# Patient Record
Sex: Female | Born: 1975 | Hispanic: Yes | Marital: Married | Smoking: Never smoker
Health system: Southern US, Community
[De-identification: ages and names within clinical notes are randomized; demographics above are authoritative.]

## PROBLEM LIST (undated history)

## (undated) DIAGNOSIS — K219 Gastro-esophageal reflux disease without esophagitis: Secondary | ICD-10-CM

## (undated) DIAGNOSIS — E785 Hyperlipidemia, unspecified: Secondary | ICD-10-CM

## (undated) DIAGNOSIS — N361 Urethral diverticulum: Secondary | ICD-10-CM

## (undated) DIAGNOSIS — N9489 Other specified conditions associated with female genital organs and menstrual cycle: Secondary | ICD-10-CM

## (undated) DIAGNOSIS — E119 Type 2 diabetes mellitus without complications: Secondary | ICD-10-CM

## (undated) HISTORY — DX: Hyperlipidemia, unspecified: E78.5

## (undated) HISTORY — DX: Type 2 diabetes mellitus without complications: E11.9

## (undated) HISTORY — PX: OTHER SURGICAL HISTORY: SHX169

## (undated) HISTORY — DX: Other specified conditions associated with female genital organs and menstrual cycle: N94.89

## (undated) HISTORY — DX: Gastro-esophageal reflux disease without esophagitis: K21.9

## (undated) HISTORY — DX: Urethral diverticulum: N36.1

---

## 1997-11-03 ENCOUNTER — Other Ambulatory Visit: Admission: RE | Admit: 1997-11-03 | Discharge: 1997-11-03 | Payer: Self-pay | Admitting: *Deleted

## 1998-07-14 HISTORY — PX: ESOPHAGOGASTRODUODENOSCOPY: SHX1529

## 1998-12-17 ENCOUNTER — Other Ambulatory Visit: Admission: RE | Admit: 1998-12-17 | Discharge: 1998-12-17 | Payer: Self-pay | Admitting: Family Medicine

## 1999-04-12 ENCOUNTER — Ambulatory Visit (HOSPITAL_COMMUNITY): Admission: RE | Admit: 1999-04-12 | Discharge: 1999-04-12 | Payer: Self-pay | Admitting: Gastroenterology

## 1999-07-15 HISTORY — PX: COLPOSCOPY: SHX161

## 2002-07-14 HISTORY — PX: TYMPANOPLASTY: SHX33

## 2006-04-06 ENCOUNTER — Encounter: Payer: Self-pay | Admitting: Maternal & Fetal Medicine

## 2006-04-13 ENCOUNTER — Encounter: Payer: Self-pay | Admitting: Maternal & Fetal Medicine

## 2006-05-04 ENCOUNTER — Encounter: Payer: Self-pay | Admitting: Maternal & Fetal Medicine

## 2006-06-01 ENCOUNTER — Encounter: Payer: Self-pay | Admitting: Maternal & Fetal Medicine

## 2006-06-03 ENCOUNTER — Encounter: Payer: Self-pay | Admitting: Obstetrics & Gynecology

## 2006-06-08 ENCOUNTER — Encounter: Payer: Self-pay | Admitting: Maternal & Fetal Medicine

## 2006-06-15 ENCOUNTER — Encounter: Payer: Self-pay | Admitting: Maternal & Fetal Medicine

## 2006-06-17 ENCOUNTER — Ambulatory Visit: Payer: Self-pay | Admitting: Obstetrics & Gynecology

## 2006-06-29 ENCOUNTER — Encounter: Payer: Self-pay | Admitting: Maternal & Fetal Medicine

## 2006-07-13 ENCOUNTER — Encounter: Payer: Self-pay | Admitting: Maternal & Fetal Medicine

## 2006-07-14 ENCOUNTER — Ambulatory Visit: Payer: Self-pay | Admitting: Obstetrics & Gynecology

## 2006-08-10 ENCOUNTER — Encounter: Payer: Self-pay | Admitting: Maternal & Fetal Medicine

## 2006-08-17 ENCOUNTER — Encounter: Payer: Self-pay | Admitting: Maternal & Fetal Medicine

## 2006-08-24 ENCOUNTER — Encounter: Payer: Self-pay | Admitting: Maternal & Fetal Medicine

## 2006-08-31 ENCOUNTER — Encounter: Payer: Self-pay | Admitting: Maternal & Fetal Medicine

## 2006-09-14 ENCOUNTER — Encounter: Payer: Self-pay | Admitting: Maternal & Fetal Medicine

## 2006-09-21 ENCOUNTER — Encounter: Payer: Self-pay | Admitting: Maternal & Fetal Medicine

## 2006-10-05 ENCOUNTER — Encounter: Payer: Self-pay | Admitting: Maternal & Fetal Medicine

## 2006-10-19 ENCOUNTER — Encounter: Payer: Self-pay | Admitting: Maternal & Fetal Medicine

## 2006-10-26 ENCOUNTER — Encounter: Payer: Self-pay | Admitting: Maternal & Fetal Medicine

## 2006-11-09 ENCOUNTER — Encounter: Payer: Self-pay | Admitting: Maternal & Fetal Medicine

## 2006-11-15 ENCOUNTER — Inpatient Hospital Stay: Payer: Self-pay

## 2007-12-15 ENCOUNTER — Ambulatory Visit: Payer: Self-pay | Admitting: Urology

## 2008-02-12 HISTORY — PX: OTHER SURGICAL HISTORY: SHX169

## 2009-03-05 ENCOUNTER — Emergency Department: Payer: Self-pay | Admitting: Emergency Medicine

## 2011-11-20 ENCOUNTER — Ambulatory Visit: Payer: Self-pay | Admitting: Family Medicine

## 2015-11-28 ENCOUNTER — Other Ambulatory Visit: Payer: Self-pay | Admitting: Certified Nurse Midwife

## 2015-11-28 DIAGNOSIS — N63 Unspecified lump in unspecified breast: Secondary | ICD-10-CM

## 2015-12-05 ENCOUNTER — Ambulatory Visit
Admission: RE | Admit: 2015-12-05 | Discharge: 2015-12-05 | Disposition: A | Discharge status: Home / Self Care | Payer: 59 | Source: Ambulatory Visit | Attending: Certified Nurse Midwife | Admitting: Certified Nurse Midwife

## 2015-12-05 ENCOUNTER — Ambulatory Visit: Payer: Self-pay

## 2015-12-05 DIAGNOSIS — N63 Unspecified lump in unspecified breast: Secondary | ICD-10-CM

## 2016-11-10 ENCOUNTER — Other Ambulatory Visit: Payer: Self-pay | Admitting: Certified Nurse Midwife

## 2016-12-05 ENCOUNTER — Ambulatory Visit: Payer: Self-pay | Admitting: Certified Nurse Midwife

## 2017-01-02 ENCOUNTER — Encounter: Payer: Self-pay | Admitting: Certified Nurse Midwife

## 2017-01-02 ENCOUNTER — Ambulatory Visit (INDEPENDENT_AMBULATORY_CARE_PROVIDER_SITE_OTHER): Payer: BLUE CROSS/BLUE SHIELD | Admitting: Certified Nurse Midwife

## 2017-01-02 VITALS — BP 100/60 | HR 81 | Ht 61.0 in | Wt 115.0 lb

## 2017-01-02 DIAGNOSIS — Z1231 Encounter for screening mammogram for malignant neoplasm of breast: Secondary | ICD-10-CM

## 2017-01-02 DIAGNOSIS — Z01419 Encounter for gynecological examination (general) (routine) without abnormal findings: Secondary | ICD-10-CM

## 2017-01-02 DIAGNOSIS — Z3041 Encounter for surveillance of contraceptive pills: Secondary | ICD-10-CM

## 2017-01-02 DIAGNOSIS — Z124 Encounter for screening for malignant neoplasm of cervix: Secondary | ICD-10-CM

## 2017-01-02 DIAGNOSIS — Z1239 Encounter for other screening for malignant neoplasm of breast: Secondary | ICD-10-CM

## 2017-01-02 MED ORDER — NORETHINDRONE 0.35 MG PO TABS: 1.0000 | ORAL_TABLET | Freq: Every day | ORAL | 3 refills | Status: DC

## 2017-01-02 NOTE — Progress Notes (Signed)
Gynecology Annual Exam  PCP: Marguerita Merles, MD  Chief Complaint:  Chief Complaint  Patient presents with  . Gynecologic Exam    History of Present Illness:Dominique Valdez is a 41 year old Other Race, Hispanic or Latino female, [place], who presents for her annual exam. She is having no significant GYN problems.  Her menses are irregular on the progesterone only pill. She was having them every 6 weeks to 6 months apart. Last menses was in April and lasted 2 weeks after missing a pill.  Menses last year would last 9 days with 2 heavier days with clots requiring pad changes 4x/day. Her She reports some cramping with menses that is usually relieved with ibuprofen.    The patient's past medical history is notable for a history of diabetes, reflux and hypercholesterolemia.  Her PCP is Dr Delight Stare at [facility]. Since her last annual GYN exam dated 11/28/15, she has had no significant changes in her health history.  She is sexually active. She is currently using a progesterone only pill for contraception. Does not want any future pregnancies.  Her most recent pap smear was obtained 11/28/2015 and was negative .  Her last mammogram was 12/05/2015 and was negative. She had a diagnostic mammogram and [place] on the left breast evaluating a possible mass, but no masses seen. There is no family history of breast cancer.  There is no family history of ovarian cancer.  The patient does do occasional self breast exams.  The patient does not smoke.  The patient does drink occasionally.  The patient does not use illegal drugs.  The patient exercises by walking her dog.  The patient does get adequate calcium in her diet.  She had a recent cholesterol screen in 2017 that was normal on lovastatin    The patient denies current symptoms of depression.    Review of Systems: Review of Systems  Constitutional: Negative for chills, fever and weight loss.  HENT: Negative for congestion, sinus pain and  sore throat.   Eyes: Negative for blurred vision and pain.  Respiratory: Negative for hemoptysis, shortness of breath and wheezing.   Cardiovascular: Negative for chest pain, palpitations and leg swelling.  Gastrointestinal: Negative for abdominal pain, blood in stool, diarrhea, heartburn, nausea and vomiting.  Genitourinary: Negative for dysuria, frequency, hematuria and urgency.       Positive for irregular bleeding  Musculoskeletal: Negative for back pain, joint pain and myalgias.  Skin: Negative for itching and rash.  Neurological: Negative for dizziness, tingling and headaches.  Endo/Heme/Allergies: Negative for environmental allergies and polydipsia. Does not bruise/bleed easily.       Negative for hirsutism, hot flashes   Psychiatric/Behavioral: Negative for depression. The patient is not nervous/anxious and does not have insomnia.     Past Medical History:  Past Medical History:  Diagnosis Date  . Adnexal mass   . Diabetes mellitus without complication ([place])   . GERD (gastroesophageal reflux disease)   . Hyperlipidemia   . Urethral diverticulum     Past Surgical History:  Past Surgical History:  Procedure Laterality Date  . COLPOSCOPY  2001   normal PJR  . ESOPHAGOGASTRODUODENOSCOPY  2000   [place]  . excision of urethral diverticulum  02/2008  . myringtomy     as a child  . TYMPANOPLASTY Left 2004    Family History:  Family History  Problem Relation Age of Onset  . Coronary artery disease Mother   . Diabetes Mother   .  Hypertension Mother   . Hypertension Father   . Diabetes Brother   . Hypertension Maternal Grandmother   . Renal Disease Maternal Grandmother   . Leukemia Paternal Grandmother 74  . Hyperthyroidism Paternal Grandmother   . Hypertension Paternal Grandfather   . Alzheimer's disease Paternal Grandfather     Social History:  Social History   Social History  . Marital status: Single    Spouse name: N/A  . Number of children: 2  .  Years of education: N/A   Occupational History  . Patient Care Coordinator    Social History Main Topics  . Smoking status: Never Smoker  . Smokeless tobacco: Never Used  . Alcohol use Yes     Comment: rare  . Drug use: No  . Sexual activity: Yes    Birth control/ protection: Pill     Comment: minipill   Other Topics Concern  . Not on file   Social History Narrative  . No narrative on file    Allergies:  Allergies  Allergen Reactions  . Bactrim [Sulfamethoxazole-Trimethoprim] Rash    Medications:  Current Outpatient Prescriptions:  .  fexofenadine (ALLEGRA) 180 MG tablet, Take 180 mg by mouth daily., Disp: , Rfl:  .  glipiZIDE (GLUCOTROL) 5 MG tablet, Take 5 mg by mouth daily before breakfast., Disp: , Rfl:  .  lisinopril (PRINIVIL,ZESTRIL) 2.5 MG tablet, , Disp: , Rfl: 10 .  lovastatin (MEVACOR) 20 MG tablet, , Disp: , Rfl: 10 .  norethindrone (CAMILA) 0.35 MG tablet, Take 1 tablet (0.35 mg total) by mouth daily., Disp: 84 tablet, Rfl: 3 .  ranitidine (ZANTAC) 150 MG tablet, Take 150 mg by mouth 2 (two) times daily., Disp: , Rfl:  .  Saxagliptin-Metformin (KOMBIGLYZE XR) 11-998 MG TB24, Take 1 tablet by mouth 2 (two) times daily., Disp: , Rfl:     Physical Exam Vitals: BP 100/60   Pulse 81   Ht 5' 1"  (1.549 m)   Wt 115 lb (52.2 kg)   LMP 10/26/2016 (Approximate)   BMI 21.73 kg/m   General: NAD HEENT: normocephalic, anicteric Neck: no thyroid enlargement, no palpable nodules, no cervical lymphadenopathy  Pulmonary: No increased work of breathing, CTAB Cardiovascular: RRR, without murmur  Breast: Breast symmetrical, no tenderness, no palpable nodules or masses, no skin or nipple retraction present, no nipple discharge.  No axillary, infraclavicular or supraclavicular lymphadenopathy. Abdomen: Soft, non-tender, non-distended.  Umbilicus without lesions.  No hepatomegaly or masses palpable. No evidence of hernia. Genitourinary:  External: Normal external female  genitalia.  Normal urethral meatus, normal Bartholin's and Skene's glands.    Vagina: Normal vaginal mucosa, no evidence of prolapse.    Cervix: anterior, parous, grossly normal in appearance, no bleeding, non-tender  Uterus: Retroflexed, normal size, shape, and consistency, decreased mobility, and non-tender  Adnexa: No adnexal masses, non-tender  Rectal: deferred  Lymphatic: no evidence of inguinal lymphadenopathy Extremities: no edema, erythema, or tenderness Neurologic: Grossly intact Psychiatric: mood appropriate, affect full     Assessment: 41 y.o. well woman exam Plan:   1) Breast cancer screening - recommend monthly self breast exam and annual mammograms. Mammogram was ordered today.  2) Cervical cancer screening - Pap was done. ASCCP guidelines and rational discussed.  Patient opts for yearly screening interval  3) Contraception -patient wishes to continue with the minipill. RX for Camilla x 1 year  4) RTO 1 year and prn  Dalia Heading, CNM

## 2017-01-03 ENCOUNTER — Encounter: Payer: Self-pay | Admitting: Certified Nurse Midwife

## 2017-01-03 DIAGNOSIS — E119 Type 2 diabetes mellitus without complications: Secondary | ICD-10-CM | POA: Insufficient documentation

## 2017-01-03 DIAGNOSIS — Z309 Encounter for contraceptive management, unspecified: Secondary | ICD-10-CM | POA: Insufficient documentation

## 2017-01-03 DIAGNOSIS — E785 Hyperlipidemia, unspecified: Secondary | ICD-10-CM | POA: Insufficient documentation

## 2017-01-03 DIAGNOSIS — K219 Gastro-esophageal reflux disease without esophagitis: Secondary | ICD-10-CM | POA: Insufficient documentation

## 2017-01-05 LAB — IGP,RFX APTIMA HPV ALL PTH: PAP Smear Comment: 0

## 2017-02-19 ENCOUNTER — Other Ambulatory Visit: Payer: Self-pay | Admitting: Nurse Practitioner

## 2017-02-19 DIAGNOSIS — K30 Functional dyspepsia: Secondary | ICD-10-CM

## 2017-02-19 DIAGNOSIS — R1013 Epigastric pain: Secondary | ICD-10-CM

## 2017-02-20 ENCOUNTER — Ambulatory Visit
Admission: RE | Admit: 2017-02-20 | Discharge: 2017-02-20 | Disposition: A | Discharge status: Home / Self Care | Payer: BLUE CROSS/BLUE SHIELD | Source: Ambulatory Visit | Attending: Nurse Practitioner | Admitting: Nurse Practitioner

## 2017-02-20 DIAGNOSIS — R1013 Epigastric pain: Secondary | ICD-10-CM | POA: Insufficient documentation

## 2017-02-20 DIAGNOSIS — K30 Functional dyspepsia: Secondary | ICD-10-CM

## 2017-09-10 ENCOUNTER — Other Ambulatory Visit: Payer: Self-pay | Admitting: Certified Nurse Midwife

## 2017-09-10 DIAGNOSIS — Z1231 Encounter for screening mammogram for malignant neoplasm of breast: Secondary | ICD-10-CM

## 2017-10-09 ENCOUNTER — Encounter (HOSPITAL_COMMUNITY): Payer: Self-pay

## 2017-10-09 ENCOUNTER — Ambulatory Visit
Admission: RE | Admit: 2017-10-09 | Discharge: 2017-10-09 | Disposition: A | Discharge status: Home / Self Care | Payer: BLUE CROSS/BLUE SHIELD | Source: Ambulatory Visit | Attending: Certified Nurse Midwife | Admitting: Certified Nurse Midwife

## 2017-10-09 DIAGNOSIS — Z1231 Encounter for screening mammogram for malignant neoplasm of breast: Secondary | ICD-10-CM | POA: Diagnosis present

## 2018-01-07 ENCOUNTER — Other Ambulatory Visit: Payer: Self-pay | Admitting: Certified Nurse Midwife

## 2018-01-12 NOTE — Progress Notes (Addendum)
Gynecology Annual Exam  PCP: Marguerita Merles, MD  Chief Complaint:  Chief Complaint  Patient presents with  . Gynecologic Exam    History of Present Illness:Dominique Valdez is a 42 year old  Hispanic or Latino female, [place], who presents for her annual exam. She is having some problem with lumpy tender area in her left breast. Her menses have been infrequent this past year on the progesterone only pill. She was having them every 6 weeks to 6 months apart prior to that..Last year she had only one menses. It lasted 2 weeks and was accompanied by cramping.  The patient's past medical history is notable for a history of diabetes, reflux and hypercholesterolemia.  Her PCP is Dr Delight Stare at [facility]. Since her last annual GYN exam dated 01/02/17, she has had no significant changes in her health history.  She is sexually active. She is currently using a progesterone only pill for contraception. Does not want any future pregnancies.  Her most recent pap smear was obtained 01/02/17 and was negative .  Her last mammogram was 10/09/17 and was negative. She had a diagnostic mammogram and [place] on the left breast evaluating a possible mass in the same area where she has felt tender lumpiness in 2017 There is no family history of breast cancer.  There is no family history of ovarian cancer.  The patient does do occasional self breast exams.  The patient does not smoke.  The patient does drink occasionally.  The patient does not use illegal drugs.  The patient exercises by walking her dog.  The patient may not get adequate calcium in her diet.  She had a recent cholesterol screen in 2018 that was borderline    The patient denies current symptoms of depression.    Review of Systems: Review of Systems  Constitutional: Negative for chills, fever and weight loss.  HENT: Negative for congestion, sinus pain and sore throat.   Eyes: Negative for blurred vision and pain.  Respiratory: Negative  for hemoptysis, shortness of breath and wheezing.   Cardiovascular: Negative for chest pain, palpitations and leg swelling.  Gastrointestinal: Negative for abdominal pain, blood in stool, diarrhea, heartburn, nausea and vomiting.  Genitourinary: Negative for dysuria, frequency, hematuria and urgency.       Positive for oligomenorrhea. Positive for vaginal odor and discharge at times  Musculoskeletal: Negative for back pain, joint pain and myalgias.  Skin: Negative for itching and rash.  Neurological: Negative for dizziness, tingling and headaches.  Endo/Heme/Allergies: Negative for environmental allergies and polydipsia. Does not bruise/bleed easily.       Negative for hirsutism, hot flashes   Psychiatric/Behavioral: Negative for depression. The patient is not nervous/anxious and does not have insomnia.   Breasts: positive for lumps and tenderness in left breast  Past Medical History:  Past Medical History:  Diagnosis Date  . Adnexal mass   . Diabetes mellitus without complication ([place])   . GERD (gastroesophageal reflux disease)   . Hyperlipidemia   . Urethral diverticulum     Past Surgical History:  Past Surgical History:  Procedure Laterality Date  . COLPOSCOPY  2001   normal PJR  . ESOPHAGOGASTRODUODENOSCOPY  2000   [place]  . excision of urethral diverticulum  02/2008  . myringtomy     as a child  . TYMPANOPLASTY Left 2004    Family History:  Family History  Problem Relation Age of Onset  . Coronary artery disease Mother   .  Diabetes Mother   . Hypertension Mother   . Hypertension Father   . Diabetes Brother   . Hypertension Maternal Grandmother   . Renal Disease Maternal Grandmother   . Leukemia Paternal Grandmother 29  . Hyperthyroidism Paternal Grandmother   . Hypertension Paternal Grandfather   . Alzheimer's disease Paternal Grandfather   . Breast cancer Neg Hx     Social History:  Social History   Socioeconomic History  . Marital status: Married      Spouse name: Not on file  . Number of children: 2  . Years of education: Not on file  . Highest education level: Not on file  Occupational History  . Occupation: Patient Care Coordinator  Social Needs  . Financial resource strain: Not on file  . Food insecurity:    Worry: Not on file    Inability: Not on file  . Transportation needs:    Medical: Not on file    Non-medical: Not on file  Tobacco Use  . Smoking status: Never Smoker  . Smokeless tobacco: Never Used  Substance and Sexual Activity  . Alcohol use: Yes    Comment: rare  . Drug use: No  . Sexual activity: Yes    Birth control/protection: Pill    Comment: minipill  Lifestyle  . Physical activity:    Days per week: 0 days    Minutes per session: 0 min  . Stress: Not at all  Relationships  . Social connections:    Talks on phone: Not on file    Gets together: Not on file    Attends religious service: Not on file    Active member of club or organization: Not on file    Attends meetings of clubs or organizations: Not on file    Relationship status: Not on file  . Intimate partner violence:    Fear of current or ex partner: Not on file    Emotionally abused: Not on file    Physically abused: Not on file    Forced sexual activity: Not on file  Other Topics Concern  . Not on file  Social History Narrative  . Not on file    Allergies:  Allergies  Allergen Reactions  . Bactrim [Sulfamethoxazole-Trimethoprim] Rash    Medications:  Current Outpatient Medications:  .  CAMILA 0.35 MG tablet, TAKE 1 TABLET BY MOUTH EVERY DAY, Disp: 84 tablet, Rfl: 0 .  fexofenadine (ALLEGRA) 180 MG tablet, Take 180 mg by mouth daily., Disp: , Rfl:  .  fluticasone (FLONASE) 50 MCG/ACT nasal spray, , Disp: , Rfl: 1 .  ibuprofen (ADVIL,MOTRIN) 800 MG tablet, , Disp: , Rfl: 5 .  lisinopril (PRINIVIL,ZESTRIL) 2.5 MG tablet, , Disp: , Rfl: 10 .  ranitidine (ZANTAC) 150 MG tablet, Take 150 mg by mouth 2 (two) times daily., Disp: ,  Rfl:  .  Saxagliptin-Metformin (KOMBIGLYZE XR) 11-998 MG TB24, Take 1 tablet by mouth 2 (two) times daily., Disp: , Rfl:     Physical Exam Vitals: BP 108/68   Pulse 81   Ht 5' 1"  (1.549 m)   Wt 113 lb (51.3 kg)   BMI 21.35 kg/m   General: Hispanic femlae in  NAD HEENT: normocephalic, anicteric Neck: no thyroid enlargement, no palpable nodules, no cervical lymphadenopathy  Pulmonary: No increased work of breathing, CTAB Cardiovascular: RRR, without murmur  Breast: Tender irregular lumpy area at 3 o'clock in the left breast, no masses in the right breast.  No skin or nipple retraction present, no nipple  discharge.  No axillary, infraclavicular or supraclavicular lymphadenopathy.  Physical Exam  Pulmonary/Chest:          Abdomen: Soft, non-tender, non-distended.  Umbilicus without lesions.  No hepatomegaly or masses palpable. No evidence of hernia. Genitourinary:  External: Normal external female genitalia.  Normal urethral meatus, normal Bartholin's and Skene's glands.    Vagina: Normal vaginal mucosa, no evidence of prolapse.    Cervix: anterior, parous, grossly normal in appearance, no bleeding, non-tender  Uterus: Retroflexed, normal size, shape, and consistency, decreased mobility, and non-tender  Adnexa: No adnexal masses, non-tender  Rectal: deferred  Lymphatic: no evidence of inguinal lymphadenopathy Extremities: no edema, erythema, or tenderness Neurologic: Grossly intact Psychiatric: mood appropriate, affect full     Assessment/Plan: 42 y.o. well woman exam Left breast asymmetry on exam R/O left breast mass  Diagnostic left breast mammogram and left breast ultrasoun  Cervical cancer screening - Pap was done. ASCCP guidelines and rational discussed.  Patient opts for yearly screening interval Contraception -patient wishes to continue with the minipill. RX for [employer] x 1 year RTO 1 year and prn  Dalia Heading, CNM

## 2018-01-13 ENCOUNTER — Encounter: Payer: Self-pay | Admitting: Certified Nurse Midwife

## 2018-01-13 ENCOUNTER — Other Ambulatory Visit (HOSPITAL_COMMUNITY)
Admission: RE | Admit: 2018-01-13 | Discharge: 2018-01-13 | Disposition: A | Discharge status: Home / Self Care | Payer: BLUE CROSS/BLUE SHIELD | Source: Ambulatory Visit | Attending: Certified Nurse Midwife | Admitting: Certified Nurse Midwife

## 2018-01-13 ENCOUNTER — Ambulatory Visit (INDEPENDENT_AMBULATORY_CARE_PROVIDER_SITE_OTHER): Payer: BLUE CROSS/BLUE SHIELD | Admitting: Certified Nurse Midwife

## 2018-01-13 VITALS — BP 108/68 | HR 81 | Ht 61.0 in | Wt 113.0 lb

## 2018-01-13 DIAGNOSIS — Z01419 Encounter for gynecological examination (general) (routine) without abnormal findings: Secondary | ICD-10-CM

## 2018-01-13 DIAGNOSIS — Z124 Encounter for screening for malignant neoplasm of cervix: Secondary | ICD-10-CM

## 2018-01-13 DIAGNOSIS — N6320 Unspecified lump in the left breast, unspecified quadrant: Secondary | ICD-10-CM

## 2018-01-14 ENCOUNTER — Encounter: Payer: Self-pay | Admitting: Certified Nurse Midwife

## 2018-01-14 MED ORDER — NORETHINDRONE 0.35 MG PO TABS: 1.0000 | ORAL_TABLET | Freq: Every day | ORAL | 3 refills | Status: DC

## 2018-01-19 LAB — CYTOLOGY - PAP
Diagnosis: NEGATIVE
HPV: NOT DETECTED

## 2018-01-22 ENCOUNTER — Other Ambulatory Visit: Payer: BLUE CROSS/BLUE SHIELD

## 2018-02-01 ENCOUNTER — Ambulatory Visit
Admission: RE | Admit: 2018-02-01 | Discharge: 2018-02-01 | Disposition: A | Discharge status: Home / Self Care | Payer: BLUE CROSS/BLUE SHIELD | Source: Ambulatory Visit | Attending: Certified Nurse Midwife | Admitting: Certified Nurse Midwife

## 2018-02-01 DIAGNOSIS — N6320 Unspecified lump in the left breast, unspecified quadrant: Secondary | ICD-10-CM | POA: Diagnosis not present

## 2018-06-09 ENCOUNTER — Encounter: Payer: Self-pay | Admitting: Obstetrics and Gynecology

## 2018-06-09 ENCOUNTER — Ambulatory Visit (INDEPENDENT_AMBULATORY_CARE_PROVIDER_SITE_OTHER): Payer: BLUE CROSS/BLUE SHIELD | Admitting: Obstetrics and Gynecology

## 2018-06-09 VITALS — BP 130/80 | HR 82 | Ht 61.0 in | Wt 114.0 lb

## 2018-06-09 DIAGNOSIS — B373 Candidiasis of vulva and vagina: Secondary | ICD-10-CM

## 2018-06-09 DIAGNOSIS — B3731 Acute candidiasis of vulva and vagina: Secondary | ICD-10-CM

## 2018-06-09 LAB — POCT WET PREP WITH KOH
Clue Cells Wet Prep HPF POC: NEGATIVE
KOH Prep POC: NEGATIVE
TRICHOMONAS UA: NEGATIVE
Yeast Wet Prep HPF POC: NEGATIVE

## 2018-06-09 MED ORDER — FLUCONAZOLE 150 MG PO TABS: 150.0000 mg | ORAL_TABLET | Freq: Once | ORAL | 0 refills | Status: DC

## 2018-06-09 NOTE — Patient Instructions (Signed)
I value your feedback and entrusting us with your care. If you get a [facility] patient survey, I would appreciate you taking the time to let us know about your experience today. Thank you! 

## 2018-06-09 NOTE — Progress Notes (Signed)
Marguerita Merles, MD   Chief Complaint  Patient presents with  . Bacterial Vaginosis    swollen and irritated area, clear discharge, itchiness, no odor x 4 days    HPI:      [name] is a 42 y.o. [id number] who LMP was Patient's last menstrual period was 05/27/2018 (approximate)., presents today for vaginal d/c, irritation, swelling, no odor for the past 6 days. Treated with monistat-3 a few days ago with some relief. Pt with DM and has not had good blood sugar control recently due to "holiday eating". No recent abx use. No soap/detergent change. No urin sx.    Past Medical History:  Diagnosis Date  . Adnexal mass   . Diabetes mellitus without complication ([place])   . GERD (gastroesophageal reflux disease)   . Hyperlipidemia   . Urethral diverticulum     Past Surgical History:  Procedure Laterality Date  . COLPOSCOPY  2001   normal PJR  . ESOPHAGOGASTRODUODENOSCOPY  2000   [place]  . excision of urethral diverticulum  02/2008  . myringtomy     as a child  . TYMPANOPLASTY Left 2004    Family History  Problem Relation Age of Onset  . Coronary artery disease Mother   . Diabetes Mother        Type 2  . Hypertension Mother   . Hypertension Father   . Diabetes Brother        Type 2  . Hypertension Maternal Grandmother   . Renal Disease Maternal Grandmother   . Leukemia Paternal Grandmother 49  . Hypertension Paternal Grandfather   . Alzheimer's disease Paternal Grandfather   . Hyperthyroidism Paternal Grandfather   . Breast cancer Neg Hx     Social History   Socioeconomic History  . Marital status: Married    Spouse name: Not on file  . Number of children: 2  . Years of education: Not on file  . Highest education level: Not on file  Occupational History  . Occupation: Patient Care Coordinator  Social Needs  . Financial resource strain: Not on file  . Food insecurity:    Worry: Not on file    Inability: Not on file  . Transportation needs:   Medical: Not on file    Non-medical: Not on file  Tobacco Use  . Smoking status: Never Smoker  . Smokeless tobacco: Never Used  Substance and Sexual Activity  . Alcohol use: Yes    Comment: rare  . Drug use: No  . Sexual activity: Yes    Birth control/protection: Pill    Comment: minipill  Lifestyle  . Physical activity:    Days per week: 0 days    Minutes per session: 0 min  . Stress: Not at all  Relationships  . Social connections:    Talks on phone: Not on file    Gets together: Not on file    Attends religious service: Not on file    Active member of club or organization: Not on file    Attends meetings of clubs or organizations: Not on file    Relationship status: Not on file  . Intimate partner violence:    Fear of current or ex partner: Not on file    Emotionally abused: Not on file    Physically abused: Not on file    Forced sexual activity: Not on file  Other Topics Concern  . Not on file  Social History Narrative  . Not on file  Outpatient Medications Prior to Visit  Medication Sig Dispense Refill  . fexofenadine (ALLEGRA) 180 MG tablet Take 180 mg by mouth daily.    . fluticasone (FLONASE) 50 MCG/ACT nasal spray   1  . ibuprofen (ADVIL,MOTRIN) 800 MG tablet   5  . lisinopril (PRINIVIL,ZESTRIL) 2.5 MG tablet   10  . lovastatin (MEVACOR) 10 MG tablet   1  . norethindrone (CAMILA) 0.35 MG tablet Take 1 tablet (0.35 mg total) by mouth daily. 84 tablet 3  . Saxagliptin-Metformin (KOMBIGLYZE XR) 11-998 MG TB24 Take 1 tablet by mouth 2 (two) times daily.    . ranitidine (ZANTAC) 150 MG tablet Take 150 mg by mouth 2 (two) times daily.     No facility-administered medications prior to visit.       ROS:  Review of Systems  Constitutional: Negative for fever.  Gastrointestinal: Negative for blood in stool, constipation, diarrhea, nausea and vomiting.  Genitourinary: Positive for vaginal discharge. Negative for dyspareunia, dysuria, flank pain, frequency,  hematuria, urgency, vaginal bleeding and vaginal pain.  Musculoskeletal: Negative for back pain.  Skin: Negative for rash.     OBJECTIVE:   Vitals:  BP 130/80   Pulse 82   Ht 5' 1"  (1.549 m)   Wt 114 lb (51.7 kg)   LMP 05/27/2018 (Approximate)   BMI 21.54 kg/m   Physical Exam  Constitutional: She is oriented to person, place, and time. Vital signs are normal. She appears well-developed.  Pulmonary/Chest: Effort normal.  Genitourinary: Vagina normal and uterus normal. There is no rash, tenderness or lesion on the right labia. There is no rash, tenderness or lesion on the left labia. Uterus is not enlarged and not tender. Cervix exhibits no motion tenderness. Right adnexum displays no mass and no tenderness. Left adnexum displays no mass and no tenderness. No erythema or tenderness in the vagina. No vaginal discharge found.  Genitourinary Comments: EXT ERYTHEMA, MILD SWELLING; NO LESIONS  Musculoskeletal: Normal range of motion.  Neurological: She is alert and oriented to person, place, and time.  Psychiatric: She has a normal mood and affect. Her behavior is normal. Thought content normal.  Vitals reviewed.   Results: Results for orders placed or performed in visit on 06/09/18 (from the past 24 hour(s))  POCT Wet Prep with KOH     Status: Normal   Collection Time: 06/09/18 11:10 AM  Result Value Ref Range   Trichomonas, UA Negative    Clue Cells Wet Prep HPF POC neg    Epithelial Wet Prep HPF POC     Yeast Wet Prep HPF POC neg    Bacteria Wet Prep HPF POC     RBC Wet Prep HPF POC     WBC Wet Prep HPF POC     KOH Prep POC Negative Negative     Assessment/Plan: Candidal vaginitis - Neg wet prep/pos sx and exam. Rx diflucan. F/u prn.  - Plan: POCT Wet Prep with KOH, fluconazole (DIFLUCAN) 150 MG tablet    Meds ordered this encounter  Medications  . fluconazole (DIFLUCAN) 150 MG tablet    Sig: Take 1 tablet (150 mg total) by mouth once for 1 dose.    Dispense:  1  tablet    Refill:  0    Order Specific Question:   Supervising Provider    Answer:   Gae Dry [[id number]]      Return if symptoms worsen or fail to improve.  Alicia B. Copland, PA-C 06/09/2018 11:11 AM

## 2018-06-24 ENCOUNTER — Other Ambulatory Visit: Payer: Self-pay | Admitting: Obstetrics and Gynecology

## 2018-06-24 DIAGNOSIS — B3731 Acute candidiasis of vulva and vagina: Secondary | ICD-10-CM

## 2018-06-24 DIAGNOSIS — B373 Candidiasis of vulva and vagina: Secondary | ICD-10-CM

## 2018-06-25 NOTE — Telephone Encounter (Signed)
Please advise 

## 2018-09-17 ENCOUNTER — Encounter: Payer: Self-pay | Admitting: Obstetrics and Gynecology

## 2018-09-17 ENCOUNTER — Ambulatory Visit (INDEPENDENT_AMBULATORY_CARE_PROVIDER_SITE_OTHER): Payer: Managed Care, Other (non HMO) | Admitting: Obstetrics and Gynecology

## 2018-09-17 VITALS — BP 118/70 | HR 83 | Ht 61.0 in | Wt 111.0 lb

## 2018-09-17 DIAGNOSIS — B373 Candidiasis of vulva and vagina: Secondary | ICD-10-CM | POA: Diagnosis not present

## 2018-09-17 DIAGNOSIS — N3001 Acute cystitis with hematuria: Secondary | ICD-10-CM

## 2018-09-17 DIAGNOSIS — B3731 Acute candidiasis of vulva and vagina: Secondary | ICD-10-CM

## 2018-09-17 MED ORDER — FLUCONAZOLE 150 MG PO TABS: 150.0000 mg | ORAL_TABLET | ORAL | 3 refills | Status: AC

## 2018-09-17 MED ORDER — NITROFURANTOIN MONOHYD MACRO 100 MG PO CAPS: 100.0000 mg | ORAL_CAPSULE | Freq: Two times a day (BID) | ORAL | 0 refills | Status: AC

## 2018-09-17 NOTE — Progress Notes (Signed)
Patient ID: Dominique Valdez, female   DOB: [birth year], 43 y.o.   MRN: [id number]  Reason for Consult: Vaginitis (Off and on sense November, poss yeast infection, pain and dryness during intercourse, and urine has been very cloudy recently)   Referred by Marguerita Merles, MD  Subjective:     HPI:  Dominique Valdez is a 43 y.o. female . She presents today complaining of recurrent yeast infections. She was treated for a yeast infection in November. She had relief afterwards but then the infection occurred again. She has been using OTC monistat with no relief. She has noticed vulvar itching. She is having pain with intercourse. She has also noticed her urine is cloudy. She is a diabetic patient taking oral medication. Her last hgba1c was in August. She reports that during the holidays she was indulging more in sweets.  She denies hot flashes. She is having regular menses, she does not have a family histoyr of early menopause.   Past Medical History:  Diagnosis Date  . Adnexal mass   . Diabetes mellitus without complication ([place])   . GERD (gastroesophageal reflux disease)   . Hyperlipidemia   . Urethral diverticulum    Family History  Problem Relation Age of Onset  . Coronary artery disease Mother   . Diabetes Mother        Type 2  . Hypertension Mother   . Hypertension Father   . Diabetes Brother        Type 2  . Hypertension Maternal Grandmother   . Renal Disease Maternal Grandmother   . Leukemia Paternal Grandmother 58  . Hypertension Paternal Grandfather   . Alzheimer's disease Paternal Grandfather   . Hyperthyroidism Paternal Grandfather   . Breast cancer Neg Hx    Past Surgical History:  Procedure Laterality Date  . COLPOSCOPY  2001   normal PJR  . ESOPHAGOGASTRODUODENOSCOPY  2000     . excision of urethral diverticulum  02/2008  . myringtomy     as a child  . TYMPANOPLASTY Left 2004    Short Social History:  Social History   Tobacco Use  . Smoking  status: Never Smoker  . Smokeless tobacco: Never Used  Substance Use Topics  . Alcohol use: Yes    Comment: rare    Allergies  Allergen Reactions  . Bactrim [Sulfamethoxazole-Trimethoprim] Rash    Current Outpatient Medications  Medication Sig Dispense Refill  . fexofenadine (ALLEGRA) 180 MG tablet Take 180 mg by mouth daily.    . fluticasone (FLONASE) 50 MCG/ACT nasal spray   1  . lisinopril (PRINIVIL,ZESTRIL) 2.5 MG tablet   10  . lovastatin (MEVACOR) 10 MG tablet   1  . norethindrone (CAMILA) 0.35 MG tablet Take 1 tablet (0.35 mg total) by mouth daily. 84 tablet 3  . Saxagliptin-Metformin (KOMBIGLYZE XR) 11-998 MG TB24 Take 1 tablet by mouth 2 (two) times daily.    . fluconazole (DIFLUCAN) 150 MG tablet Take 1 tablet (150 mg total) by mouth every 3 (three) days for 2 doses. 2 tablet 3  . ibuprofen (ADVIL,MOTRIN) 800 MG tablet   5  . nitrofurantoin, macrocrystal-monohydrate, (MACROBID) 100 MG capsule Take 1 capsule (100 mg total) by mouth 2 (two) times daily for 5 days. 10 capsule 0   No current facility-administered medications for this visit.     Review of Systems  Constitutional: Negative for chills, fatigue, fever and unexpected weight change.  HENT: Negative for trouble swallowing.  Eyes: Negative for loss of vision.  Respiratory: Negative for cough, shortness of breath and wheezing.  Cardiovascular: Negative for chest pain, leg swelling, palpitations and syncope.  GI: Negative for abdominal pain, blood in stool, diarrhea, nausea and vomiting.  GU: Negative for difficulty urinating, dysuria, frequency and hematuria.  Musculoskeletal: Negative for back pain, leg pain and joint pain.  Skin: Negative for rash.  Neurological: Negative for dizziness, headaches, light-headedness, numbness and seizures.  Psychiatric: Negative for behavioral problem, confusion, depressed mood and sleep disturbance.        Objective:  Objective   Vitals:   09/17/18 1402  BP: 118/70    Pulse: 83  Weight: 111 lb (50.3 kg)  Height: 5' 1"  (1.549 m)   Body mass index is 20.97 kg/m.  Physical Exam Vitals signs and nursing note reviewed.  Constitutional:      Appearance: She is well-developed.  HENT:     Head: Normocephalic and atraumatic.  Eyes:     Pupils: Pupils are equal, round, and reactive to light.  Cardiovascular:     Rate and Rhythm: Normal rate and regular rhythm.  Pulmonary:     Effort: Pulmonary effort is normal. No respiratory distress.  Genitourinary:    General: Normal vulva.     Comments: White clumpy discharge clinging to vaginal walls Skin:    General: Skin is warm and dry.  Neurological:     Mental Status: She is alert and oriented to person, place, and time.  Psychiatric:        Behavior: Behavior normal.        Thought Content: Thought content normal.        Judgment: Judgment normal.         Assessment/Plan:     43 yo 1. Yeast vaginitis- rx for diflucan sent with refills. Discussed stopping washing bottom with soap. Recommended diet changes to promote healthy gut bacteria. Discussed daily probiotic or daily yogurt.  Discussed option for suppressive therapy if having 3 or more infections in a year. Reassurance about vaginal pain at this time likely related to infection.   2. UTI- rx for macrobid sent.   3. She will follow up with her PCP at [facility] for further testing to evaluate glucose control  More than 25 minutes were spent face to face with the patient in the room with more than 50% of the time spent providing counseling and discussing the plan of management.    Will follow up in 1 month if needed. Adrian Prows MD [facility] 09/17/2018 2:54 PM

## 2018-09-19 LAB — URINE CULTURE

## 2018-09-20 NOTE — Progress Notes (Signed)
Please call patient with normal results.  Thank you!

## 2018-09-20 NOTE — Progress Notes (Signed)
Left vm for pt that culture was normal.

## 2019-02-07 ENCOUNTER — Other Ambulatory Visit: Payer: Self-pay | Admitting: Certified Nurse Midwife

## 2019-02-18 ENCOUNTER — Other Ambulatory Visit (HOSPITAL_COMMUNITY)
Admission: RE | Admit: 2019-02-18 | Discharge: 2019-02-18 | Disposition: A | Discharge status: Home / Self Care | Payer: Managed Care, Other (non HMO) | Source: Ambulatory Visit | Attending: Certified Nurse Midwife | Admitting: Certified Nurse Midwife

## 2019-02-18 ENCOUNTER — Encounter: Payer: Self-pay | Admitting: Certified Nurse Midwife

## 2019-02-18 ENCOUNTER — Other Ambulatory Visit: Payer: Self-pay

## 2019-02-18 ENCOUNTER — Ambulatory Visit (INDEPENDENT_AMBULATORY_CARE_PROVIDER_SITE_OTHER): Payer: Managed Care, Other (non HMO) | Admitting: Certified Nurse Midwife

## 2019-02-18 VITALS — BP 110/70 | HR 93 | Ht 61.0 in | Wt 110.8 lb

## 2019-02-18 DIAGNOSIS — Z01419 Encounter for gynecological examination (general) (routine) without abnormal findings: Secondary | ICD-10-CM

## 2019-02-18 DIAGNOSIS — Z124 Encounter for screening for malignant neoplasm of cervix: Secondary | ICD-10-CM | POA: Insufficient documentation

## 2019-02-18 DIAGNOSIS — Z1239 Encounter for other screening for malignant neoplasm of breast: Secondary | ICD-10-CM

## 2019-02-18 DIAGNOSIS — Z3041 Encounter for surveillance of contraceptive pills: Secondary | ICD-10-CM

## 2019-02-18 NOTE — Progress Notes (Signed)
Gynecology Annual Exam  PCP: Marguerita Merles, MD  Chief Complaint:  Chief Complaint  Patient presents with  . Gynecologic Exam    History of Present Illness:Dominique Valdez is a 43 year old  Hispanic or Latino female, [place], who presents for her annual exam.  Her menses have been infrequent the past two years on the progesterone only pill.Last year she had only one menses. It lasted 2 weeks and was accompanied by cramping.  She dose have spotting if she is late taking the pill.  The patient's past medical history is notable for a history of diabetes, reflux and hypercholesterolemia.  Her PCP is Dr Delight Stare at [facility] Since her last annual GYN exam dated 01/13/2018 she has had no significant changes in her health history.  She is sexually active. She is currently using a progesterone only pill for contraception. Does not want any future pregnancies.  Her most recent pap smear was obtained 01/13/2018 and was NIL/negative HRHPV .  Her last mammogram was 10/09/17 and was negative. She had a diagnostic mammogram and [place] on the left breast 02/01/2018 evaluating a possible mass at 3 o'clock( same area where she palpated lumpiness in 2017) and it was benign. There is no family history of breast cancer.  There is no family history of ovarian cancer.  The patient does do occasional self breast exams.  The patient does not smoke.  The patient does drink rarely.  The patient does not use illegal drugs.  The patient exercises by walking her dog.  The patient may get adequate calcium in her diet. She is taking vitamin D3 supplements. She had a recent cholesterol screen in 2020 that was normal, and her Lipitor dose was decreased to 20 mgm daily.    The patient denies current symptoms of depression.    Review of Systems: Review of Systems  Constitutional: Negative for chills, fever and weight loss.  HENT: Negative for congestion, sinus pain and sore throat.   Eyes: Negative for blurred  vision and pain.  Respiratory: Negative for hemoptysis, shortness of breath and wheezing.   Cardiovascular: Negative for chest pain, palpitations and leg swelling.  Gastrointestinal: Negative for abdominal pain, blood in stool, diarrhea, heartburn, nausea and vomiting.  Genitourinary: Negative for dysuria, frequency, hematuria and urgency.       Positive for oligomenorrhea. Positive for vaginal odor and discharge at times  Musculoskeletal: Negative for back pain, joint pain and myalgias.  Skin: Negative for itching and rash.  Neurological: Negative for dizziness, tingling and headaches.  Endo/Heme/Allergies: Negative for environmental allergies and polydipsia. Does not bruise/bleed easily.       Negative for hirsutism, hot flashes   Psychiatric/Behavioral: Negative for depression. The patient is not nervous/anxious and does not have insomnia.   Breasts: positive for lumps and tenderness in left breast  Past Medical History:  Past Medical History:  Diagnosis Date  . Adnexal mass   . Diabetes mellitus without complication ([place])   . GERD (gastroesophageal reflux disease)   . Hyperlipidemia   . Urethral diverticulum     Past Surgical History:  Past Surgical History:  Procedure Laterality Date  . COLPOSCOPY  2001   normal PJR  . ESOPHAGOGASTRODUODENOSCOPY  2000   [place]  . excision of urethral diverticulum  02/2008  . myringtomy     as a child  . TYMPANOPLASTY Left 2004    Family History:  Family History  Problem Relation Age of Onset  . Coronary  artery disease Mother   . Diabetes Mother        Type 2  . Hypertension Mother   . Hypertension Father   . Diabetes Brother        Type 2  . Hypertension Maternal Grandmother   . Renal Disease Maternal Grandmother   . Leukemia Paternal Grandmother 22  . Hypertension Paternal Grandfather   . Alzheimer's disease Paternal Grandfather   . Hyperthyroidism Paternal Grandfather   . Breast cancer Neg Hx     Social History:   Social History   Socioeconomic History  . Marital status: Married    Spouse name: Not on file  . Number of children: 2  . Years of education: Not on file  . Highest education level: Not on file  Occupational History  . Occupation: Patient Care Coordinator  Social Needs  . Financial resource strain: Not on file  . Food insecurity    Worry: Not on file    Inability: Not on file  . Transportation needs    Medical: Not on file    Non-medical: Not on file  Tobacco Use  . Smoking status: Never Smoker  . Smokeless tobacco: Never Used  Substance and Sexual Activity  . Alcohol use: Yes    Comment: rare  . Drug use: No  . Sexual activity: Yes    Birth control/protection: Pill    Comment: minipill  Lifestyle  . Physical activity    Days per week: 0 days    Minutes per session: 0 min  . Stress: Not at all  Relationships  . Social Herbalist on phone: Not on file    Gets together: Not on file    Attends religious service: Not on file    Active member of club or organization: Not on file    Attends meetings of clubs or organizations: Not on file    Relationship status: Not on file  . Intimate partner violence    Fear of current or ex partner: Not on file    Emotionally abused: Not on file    Physically abused: Not on file    Forced sexual activity: Not on file  Other Topics Concern  . Not on file  Social History Narrative  . Not on file    Allergies:  Allergies  Allergen Reactions  . Bactrim [Sulfamethoxazole-Trimethoprim] Rash    Medications:  Current Outpatient Medications:  .  atorvastatin (LIPITOR) 20 MG tablet, Take 20 mg by mouth daily., Disp: , Rfl:  .  fexofenadine (ALLEGRA) 180 MG tablet, Take 180 mg by mouth daily., Disp: , Rfl:  .  fluticasone (FLONASE) 50 MCG/ACT nasal spray, , Disp: , Rfl: 1 .  ibuprofen (ADVIL,MOTRIN) 800 MG tablet, , Disp: , Rfl: 5 .  lisinopril (PRINIVIL,ZESTRIL) 2.5 MG tablet, , Disp: , Rfl: 10 .  norethindrone (MICRONOR)  0.35 MG tablet, TAKE 1 TABLET BY MOUTH EVERY DAY, Disp: 84 tablet, Rfl: 0 .  Saxagliptin-Metformin (KOMBIGLYZE XR) 11-998 MG TB24, Take 1 tablet by mouth 2 (two) times daily., Disp: , Rfl:     Physical Exam Vitals: BP 110/70   Pulse 93   Ht 5' 1"  (1.549 m)   Wt 110 lb 12.8 oz (50.3 kg)   LMP  (LMP Unknown)   BMI 20.94 kg/m   General: Hispanic femlae in  NAD HEENT: normocephalic, anicteric Neck: no thyroid enlargement, no palpable nodules, no cervical lymphadenopathy  Pulmonary: No increased work of breathing, CTAB Cardiovascular: RRR, without murmur  Breast: Non Tender irregular lumpy area at 3 o'clock in the left breast (no change from last breast exam), no masses in the right breast.  No skin or nipple retraction present, no nipple discharge.  No axillary, infraclavicular or supraclavicular lymphadenopathy.  Physical Exam Chest:           Abdomen: Soft, non-tender, non-distended.  Umbilicus without lesions.  No hepatomegaly or masses palpable. No evidence of hernia. Genitourinary:  External: Normal external female genitalia.  Normal urethral meatus, normal Bartholin's and Skene's glands.    Vagina: Normal vaginal mucosa, no evidence of prolapse.    Cervix: anterior, parous, grossly normal in appearance, no bleeding, non-tender  Uterus: Retroflexed, normal size, shape, and consistency, decreased mobility, and non-tender  Adnexa: No adnexal masses, non-tender  Rectal: deferred  Lymphatic: no evidence of inguinal lymphadenopathy Extremities: no edema, erythema, or tenderness Neurologic: Grossly intact Psychiatric: mood appropriate, affect full     Assessment/Plan: 43 y.o. well woman exam No change of breast exam-fibroglandular tissue in this area (left breast 3 o'clock) on diagnostic mammogram and ultrasound Cervical cancer screening - Pap was done. ASCCP guidelines and rational discussed.  Patient opts for yearly screening interval Contraception -patient wishes to  continue with the minipill. RX for [employer] x 1 year RTO 1 year and prn  Dalia Heading, CNM

## 2019-02-19 MED ORDER — NORETHINDRONE 0.35 MG PO TABS: 1.0000 | ORAL_TABLET | Freq: Every day | ORAL | 3 refills | Status: DC

## 2019-02-23 LAB — CYTOLOGY - PAP: Diagnosis: NEGATIVE

## 2019-02-28 ENCOUNTER — Encounter: Payer: Self-pay | Admitting: Certified Nurse Midwife

## 2019-04-25 ENCOUNTER — Ambulatory Visit
Admission: RE | Admit: 2019-04-25 | Discharge: 2019-04-25 | Disposition: A | Discharge status: Home / Self Care | Payer: Managed Care, Other (non HMO) | Source: Ambulatory Visit | Attending: Certified Nurse Midwife | Admitting: Certified Nurse Midwife

## 2019-04-25 DIAGNOSIS — Z1239 Encounter for other screening for malignant neoplasm of breast: Secondary | ICD-10-CM

## 2019-04-25 DIAGNOSIS — Z1231 Encounter for screening mammogram for malignant neoplasm of breast: Secondary | ICD-10-CM | POA: Diagnosis not present

## 2020-02-04 ENCOUNTER — Other Ambulatory Visit: Payer: Self-pay | Admitting: Certified Nurse Midwife

## 2020-02-06 ENCOUNTER — Other Ambulatory Visit: Payer: Self-pay

## 2020-02-06 MED ORDER — NORETHINDRONE 0.35 MG PO TABS: 1.0000 | ORAL_TABLET | Freq: Every day | ORAL | 0 refills | Status: DC

## 2020-02-06 NOTE — Telephone Encounter (Signed)
Pt calling for refill of bcp as she has made an appt.  [phone]  Refill eRx'd. Left detailed msg.

## 2020-02-23 ENCOUNTER — Ambulatory Visit (INDEPENDENT_AMBULATORY_CARE_PROVIDER_SITE_OTHER): Payer: Managed Care, Other (non HMO) | Admitting: Certified Nurse Midwife

## 2020-02-23 ENCOUNTER — Encounter: Payer: Self-pay | Admitting: Certified Nurse Midwife

## 2020-02-23 ENCOUNTER — Other Ambulatory Visit: Payer: Self-pay

## 2020-02-23 ENCOUNTER — Other Ambulatory Visit (HOSPITAL_COMMUNITY)
Admission: RE | Admit: 2020-02-23 | Discharge: 2020-02-23 | Disposition: A | Discharge status: Home / Self Care | Payer: Managed Care, Other (non HMO) | Source: Ambulatory Visit | Attending: Certified Nurse Midwife | Admitting: Certified Nurse Midwife

## 2020-02-23 VITALS — BP 91/66 | HR 86 | Ht 61.0 in | Wt 112.0 lb

## 2020-02-23 DIAGNOSIS — Z1231 Encounter for screening mammogram for malignant neoplasm of breast: Secondary | ICD-10-CM | POA: Diagnosis not present

## 2020-02-23 DIAGNOSIS — Z01419 Encounter for gynecological examination (general) (routine) without abnormal findings: Secondary | ICD-10-CM | POA: Diagnosis not present

## 2020-02-23 DIAGNOSIS — Z124 Encounter for screening for malignant neoplasm of cervix: Secondary | ICD-10-CM

## 2020-02-23 DIAGNOSIS — Z3041 Encounter for surveillance of contraceptive pills: Secondary | ICD-10-CM | POA: Diagnosis not present

## 2020-02-23 MED ORDER — NORETHINDRONE 0.35 MG PO TABS: 1.0000 | ORAL_TABLET | Freq: Every day | ORAL | 3 refills | Status: DC

## 2020-02-25 NOTE — Progress Notes (Signed)
Gynecology Annual Exam  PCP: Marguerita Merles, MD  Chief Complaint:  Chief Complaint  Patient presents with  . Gynecologic Exam    History of Present Illness:Dominique Valdez is a 44 year old  Hispanic or Latino female, [place], who presents for her annual exam.  Her menses have been infrequent the past three years on the progesterone only pill.Last year she had only two menses. One lasted 4 days and another lasted 7 days, was  Alight flow,  and was accompanied by cramping.  She takes Advil with relief.  The patient's past medical history is notable for a history of diabetes, reflux and hypercholesterolemia.  Her PCP is Dr Delight Stare at [facility]. Since her last annual GYN exam dated 02/18/2019,  she had a Covid infection. She has not received her Covid vaccine . She is sexually active. She is currently using a progesterone only pill for contraception. Does not want any future pregnancies.  Her most recent pap smear was obtained 02/18/2019 and was NIL. Her last mammogram was 10/12/202 and was negative. She had a diagnostic mammogram and [place] on the left breast 02/01/2018 evaluating a possible mass at 3 o'clock( same area where she palpated lumpiness in 2017) and it was benign. There is no family history of breast cancer.  There is no family history of ovarian cancer.  The patient does do occasional self breast exams.  The patient does not smoke.  The patient does drink rarely.  The patient does not use illegal drugs.  The patient exercises by walking her dog.  The patient may get adequate calcium in her diet. She is taking vitamin D3 supplements. She had a recent cholesterol screen in 2021 that was borderline elevated. She is taking Mevacor 10 mgm    The patient denies current symptoms of depression.    Review of Systems: Review of Systems  Constitutional: Negative for chills, fever and weight loss.  HENT: Negative for congestion, sinus pain and sore throat.   Eyes: Negative for  blurred vision and pain.  Respiratory: Negative for hemoptysis, shortness of breath and wheezing.   Cardiovascular: Negative for chest pain, palpitations and leg swelling.  Gastrointestinal: Negative for abdominal pain, blood in stool, diarrhea, heartburn, nausea and vomiting.  Genitourinary: Negative for dysuria, frequency, hematuria and urgency.       Positive for oligomenorrhea.   Musculoskeletal: Negative for back pain, joint pain and myalgias.  Skin: Negative for itching and rash.  Neurological: Negative for dizziness, tingling and headaches.  Endo/Heme/Allergies: Negative for environmental allergies and polydipsia. Does not bruise/bleed easily.          Psychiatric/Behavioral: Negative for depression. The patient is not nervous/anxious and does not have insomnia.   Breasts: negative for lumps and tenderness in left breast  Past Medical History:  Past Medical History:  Diagnosis Date  . Adnexal mass   . Diabetes mellitus without complication ([place])   . GERD (gastroesophageal reflux disease)   . Hyperlipidemia   . Urethral diverticulum     Past Surgical History:  Past Surgical History:  Procedure Laterality Date  . COLPOSCOPY  2001   normal PJR  . ESOPHAGOGASTRODUODENOSCOPY  2000   [place]  . excision of urethral diverticulum  02/2008  . myringtomy     as a child  . TYMPANOPLASTY Left 2004    Family History:  Family History  Problem Relation Age of Onset  . Coronary artery disease Mother   . Diabetes Mother  Type 2  . Hypertension Mother   . Hypertension Father   . Diabetes Brother        Type 2  . Hypertension Maternal Grandmother   . Renal Disease Maternal Grandmother   . Leukemia Paternal Grandmother 23  . Hypertension Paternal Grandfather   . Alzheimer's disease Paternal Grandfather   . Hyperthyroidism Paternal Grandfather   . Breast cancer Neg Hx     Social History:  Social History   Socioeconomic History  . Marital status: Married     Spouse name: Not on file  . Number of children: 2  . Years of education: Not on file  . Highest education level: Not on file  Occupational History  . Occupation: Patient Care Coordinator  Tobacco Use  . Smoking status: Never Smoker  . Smokeless tobacco: Never Used  Vaping Use  . Vaping Use: Never used  Substance and Sexual Activity  . Alcohol use: Yes    Comment: rare  . Drug use: No  . Sexual activity: Yes    Birth control/protection: Pill    Comment: minipill  Other Topics Concern  . Not on file  Social History Narrative  . Not on file   Social Determinants of Health   Financial Resource Strain:   . Difficulty of Paying Living Expenses:   Food Insecurity:   . Worried About Charity fundraiser in the Last Year:   . Arboriculturist in the Last Year:   Transportation Needs:   . Film/video editor (Medical):   Marland Kitchen Lack of Transportation (Non-Medical):   Physical Activity:   . Days of Exercise per Week:   . Minutes of Exercise per Session:   Stress:   . Feeling of Stress :   Social Connections:   . Frequency of Communication with Friends and Family:   . Frequency of Social Gatherings with Friends and Family:   . Attends Religious Services:   . Active Member of Clubs or Organizations:   . Attends Archivist Meetings:   Marland Kitchen Marital Status:   Intimate Partner Violence:   . Fear of Current or Ex-Partner:   . Emotionally Abused:   Marland Kitchen Physically Abused:   . Sexually Abused:     Allergies:  Allergies  Allergen Reactions  . Bactrim [Sulfamethoxazole-Trimethoprim] Rash    Medications:  Current Outpatient Medications:  .  cholecalciferol (VITAMIN D3) 25 MCG (1000 UNIT) tablet, Take 1,000 Units by mouth daily., Disp: , Rfl:  .  fexofenadine (ALLEGRA) 180 MG tablet, Take 180 mg by mouth daily., Disp: , Rfl:  .  fluticasone (FLONASE) 50 MCG/ACT nasal spray, , Disp: , Rfl: 1 .  ibuprofen (ADVIL,MOTRIN) 800 MG tablet, , Disp: , Rfl: 5 .  lisinopril  (PRINIVIL,ZESTRIL) 2.5 MG tablet, , Disp: , Rfl: 10 .  lovastatin (MEVACOR) 10 MG tablet, Take 1 tablet by mouth daily., Disp: , Rfl:  .  norethindrone (MICRONOR) 0.35 MG tablet, Take 1 tablet (0.35 mg total) by mouth daily., Disp: 84 tablet, Rfl: 3 .  Saxagliptin-Metformin (KOMBIGLYZE XR) 11-998 MG TB24, Take 1 tablet by mouth 2 (two) times daily., Disp: , Rfl:     Physical Exam Vitals: BP 91/66   Pulse 86   Ht 5' 1"  (1.549 m)   Wt 112 lb (50.8 kg)   LMP  (LMP Unknown)   BMI 21.16 kg/m   General: Hispanic femlae in  NAD HEENT: normocephalic, anicteric Neck: no thyroid enlargement, no palpable nodules, no cervical lymphadenopathy  Pulmonary: No  increased work of breathing, CTAB Cardiovascular: RRR, without murmur  Breast: no masses, or skin or nipple retraction present, no nipple discharge.  No axillary, infraclavicular or supraclavicular lymphadenopathy.  Physical Exam Chest:           Abdomen: Soft, non-tender, non-distended.  Umbilicus without lesions.  No hepatomegaly or masses palpable. No evidence of hernia. Genitourinary:  External: Normal external female genitalia.  Normal urethral meatus, normal Bartholin's and Skene's glands.    Vagina: Normal vaginal mucosa, no evidence of prolapse.    Cervix: anterior, parous, grossly normal in appearance, no bleeding, non-tender  Uterus: Retroflexed, normal size, shape, and consistency, decreased mobility, and non-tender  Adnexa: No adnexal masses, non-tender  Rectal: deferred  Lymphatic: no evidence of inguinal lymphadenopathy Extremities: no edema, erythema, or tenderness Neurologic: Grossly intact Psychiatric: mood appropriate, affect full     Assessment/Plan: 44 y.o. well woman exam  Cervical cancer screening - Pap was done. ASCCP guidelines and rational discussed.  Patient opts for yearly screening interval Contraception -patient wishes to continue with the minipill. RX for [place] x 1 year Colon cancer  screening: Discussed beginning screening at age 44. Reviewed options including FIT testing, Cologuard and colonoscopy.  Discussed calcium and vitamin D3 requirements and the role of exercise to prevent osteoporosis RTO 1 year and prn  Dalia Heading, CNM

## 2020-02-28 LAB — CYTOLOGY - PAP
Comment: NEGATIVE
Diagnosis: NEGATIVE
High risk HPV: NEGATIVE

## 2020-03-08 ENCOUNTER — Encounter: Payer: Self-pay | Admitting: Certified Nurse Midwife

## 2021-02-06 ENCOUNTER — Other Ambulatory Visit: Payer: Self-pay

## 2021-02-06 MED ORDER — NORETHINDRONE 0.35 MG PO TABS: 1.0000 | ORAL_TABLET | Freq: Every day | ORAL | 0 refills | Status: DC

## 2021-02-27 ENCOUNTER — Ambulatory Visit: Payer: Managed Care, Other (non HMO) | Admitting: Obstetrics and Gynecology

## 2021-03-19 NOTE — Progress Notes (Signed)
PCP: Marguerita Merles, MD   Chief Complaint  Patient presents with   Gynecologic Exam    No concerns    HPI:      [name] is a 45 y.o. [id number] whose LMP was No LMP recorded. (Menstrual status: Oral contraceptives)., presents today for her annual examination.  Her menses are absent on POPs unless she is late or misses a pill, then has spotting or a light period. Mild dysmen with bleeding. Wants to cont POPs.  Sex activity: single partner, contraception - oral progesterone-only contraceptive.   Last Pap: 02/23/20  Results were: no abnormalities /neg HPV DNA.   Last mammogram: 04/25/19 Results were: normal--routine follow-up in 12 months There is no FH of breast cancer. There is no FH of ovarian cancer. The patient does do self-breast exams.  Colonoscopy: never  Tobacco use: The patient denies current or previous tobacco use. Alcohol use: none No drug use Exercise: occas exercise  She does get adequate calcium but not Vitamin D in her diet.  Labs with PCP.    Past Medical History:  Diagnosis Date   Adnexal mass    Diabetes mellitus without complication (HCC)    GERD (gastroesophageal reflux disease)    Hyperlipidemia    Urethral diverticulum     Past Surgical History:  Procedure Laterality Date   COLPOSCOPY  2001   normal PJR   ESOPHAGOGASTRODUODENOSCOPY  2000   [place]   excision of urethral diverticulum  02/2008   myringtomy     as a child   TYMPANOPLASTY Left 2004    Family History  Problem Relation Age of Onset   Coronary artery disease Mother    Diabetes Mother        Type 2   Hypertension Mother    Hypertension Father    Diabetes Brother        Type 2   Hypertension Maternal Grandmother    Renal Disease Maternal Grandmother    Leukemia Paternal Grandmother 36   Hypertension Paternal Grandfather    Alzheimer's disease Paternal Grandfather    Hyperthyroidism Paternal Grandfather    Breast cancer Neg Hx     Social History    Socioeconomic History   Marital status: Married    Spouse name: Not on file   Number of children: 2   Years of education: Not on file   Highest education level: Not on file  Occupational History   Occupation: Patient Care Coordinator  Tobacco Use   Smoking status: Never   Smokeless tobacco: Never  Vaping Use   Vaping Use: Never used  Substance and Sexual Activity   Alcohol use: Yes    Comment: rare   Drug use: No   Sexual activity: Yes    Birth control/protection: Pill    Comment: minipill  Other Topics Concern   Not on file  Social History Narrative   Not on file   Social Determinants of Health   Financial Resource Strain: Not on file  Food Insecurity: Not on file  Transportation Needs: Not on file  Physical Activity: Not on file  Stress: Not on file  Social Connections: Not on file  Intimate Partner Violence: Not on file     Current Outpatient Medications:    fexofenadine (ALLEGRA) 180 MG tablet, Take 180 mg by mouth daily., Disp: , Rfl:    ibuprofen (ADVIL,MOTRIN) 800 MG tablet, , Disp: , Rfl: 5   lisinopril (PRINIVIL,ZESTRIL) 2.5 MG tablet, , Disp: , Rfl: 10  lovastatin (MEVACOR) 10 MG tablet, Take 1 tablet by mouth daily., Disp: , Rfl:    Saxagliptin-Metformin 11-998 MG TB24, Take 1 tablet by mouth 2 (two) times daily., Disp: , Rfl:    norethindrone (MICRONOR) 0.35 MG tablet, Take 1 tablet (0.35 mg total) by mouth daily., Disp: 84 tablet, Rfl: 3     ROS:  Review of Systems  Constitutional:  Negative for fatigue, fever and unexpected weight change.  Respiratory:  Negative for cough, shortness of breath and wheezing.   Cardiovascular:  Negative for chest pain, palpitations and leg swelling.  Gastrointestinal:  Negative for blood in stool, constipation, diarrhea, nausea and vomiting.  Endocrine: Negative for cold intolerance, heat intolerance and polyuria.  Genitourinary:  Negative for dyspareunia, dysuria, flank pain, frequency, genital sores,  hematuria, menstrual problem, pelvic pain, urgency, vaginal bleeding, vaginal discharge and vaginal pain.  Musculoskeletal:  Negative for back pain, joint swelling and myalgias.  Skin:  Negative for rash.  Neurological:  Negative for dizziness, syncope, light-headedness, numbness and headaches.  Hematological:  Negative for adenopathy.  Psychiatric/Behavioral:  Negative for agitation, confusion, sleep disturbance and suicidal ideas. The patient is not nervous/anxious.   BREAST: No symptoms    Objective: BP 112/80   Ht 5' 1"  (1.549 m)   Wt 110 lb (49.9 kg)   BMI 20.78 kg/m    Physical Exam Constitutional:      Appearance: She is well-developed.  Genitourinary:     Vulva normal.     Right Labia: No rash, tenderness or lesions.    Left Labia: No tenderness, lesions or rash.    No vaginal discharge, erythema or tenderness.      Right Adnexa: not tender and no mass present.    Left Adnexa: not tender and no mass present.    No cervical friability or polyp.     Uterus is not enlarged or tender.  Breasts:    Right: No mass, nipple discharge, skin change or tenderness.     Left: No mass, nipple discharge, skin change or tenderness.  Neck:     Thyroid: No thyromegaly.  Cardiovascular:     Rate and Rhythm: Normal rate and regular rhythm.     Heart sounds: Normal heart sounds. No murmur heard. Pulmonary:     Effort: Pulmonary effort is normal.     Breath sounds: Normal breath sounds.  Abdominal:     Palpations: Abdomen is soft.     Tenderness: There is no abdominal tenderness. There is no guarding or rebound.  Musculoskeletal:        General: Normal range of motion.     Cervical back: Normal range of motion.  Lymphadenopathy:     Cervical: No cervical adenopathy.  Neurological:     General: No focal deficit present.     Mental Status: She is alert and oriented to person, place, and time.     Cranial Nerves: No cranial nerve deficit.  Skin:    General: Skin is warm and dry.   Psychiatric:        Mood and Affect: Mood normal.        Behavior: Behavior normal.        Thought Content: Thought content normal.        Judgment: Judgment normal.  Vitals reviewed.    Assessment/Plan:  Encounter for annual routine gynecological examination  Encounter for surveillance of contraceptive pills - Plan: norethindrone (MICRONOR) 0.35 MG tablet; POP Rx RF  Encounter for screening mammogram for malignant neoplasm of breast -  Plan: MM 3D SCREEN BREAST BILATERAL; pt to sched mammo  Screening for colon cancer - Plan: Cologuard; colonoscopy/cologuard discussed. Pt elects cologuard. Ref sent. Will f/u with results.    Meds ordered this encounter  Medications   norethindrone (MICRONOR) 0.35 MG tablet    Sig: Take 1 tablet (0.35 mg total) by mouth daily.    Dispense:  84 tablet    Refill:  3    Order Specific Question:   Supervising Provider    Answer:   Gae Dry [[id number]]           GYN counsel breast self exam, mammography screening, menopause, adequate intake of calcium and vitamin D, diet and exercise    F/U  Return in about 1 year (around 03/20/2022).  Tennyson Kallen B. Rasheen Bells, PA-C 03/20/2021 9:21 AM

## 2021-03-20 ENCOUNTER — Encounter: Payer: Self-pay | Admitting: Obstetrics and Gynecology

## 2021-03-20 ENCOUNTER — Ambulatory Visit (INDEPENDENT_AMBULATORY_CARE_PROVIDER_SITE_OTHER): Payer: Managed Care, Other (non HMO) | Admitting: Obstetrics and Gynecology

## 2021-03-20 ENCOUNTER — Other Ambulatory Visit: Payer: Self-pay

## 2021-03-20 VITALS — BP 112/80 | Ht 61.0 in | Wt 110.0 lb

## 2021-03-20 DIAGNOSIS — Z01419 Encounter for gynecological examination (general) (routine) without abnormal findings: Secondary | ICD-10-CM | POA: Diagnosis not present

## 2021-03-20 DIAGNOSIS — Z1231 Encounter for screening mammogram for malignant neoplasm of breast: Secondary | ICD-10-CM | POA: Diagnosis not present

## 2021-03-20 DIAGNOSIS — Z3041 Encounter for surveillance of contraceptive pills: Secondary | ICD-10-CM | POA: Diagnosis not present

## 2021-03-20 DIAGNOSIS — Z1211 Encounter for screening for malignant neoplasm of colon: Secondary | ICD-10-CM

## 2021-03-20 MED ORDER — NORETHINDRONE 0.35 MG PO TABS: 1.0000 | ORAL_TABLET | Freq: Every day | ORAL | 3 refills | Status: DC

## 2021-03-20 NOTE — Patient Instructions (Addendum)
I value your feedback and you entrusting [place] with your care. If you get a [facility] patient survey, I would appreciate you taking the time to let us know about your experience today. Thank you!  [facility]: [phone]

## 2021-04-13 DIAGNOSIS — Z1211 Encounter for screening for malignant neoplasm of colon: Secondary | ICD-10-CM

## 2021-04-13 HISTORY — DX: Encounter for screening for malignant neoplasm of colon: Z12.11

## 2021-04-26 LAB — COLOGUARD: Cologuard: NEGATIVE

## 2021-04-28 ENCOUNTER — Encounter: Payer: Self-pay | Admitting: Obstetrics and Gynecology

## 2021-04-28 NOTE — Progress Notes (Signed)
Pls let pt know Cologuard neg. Due for rescreening in 3 yrs.

## 2021-04-29 NOTE — Progress Notes (Signed)
Pt aware.

## 2021-05-10 ENCOUNTER — Ambulatory Visit
Admission: RE | Admit: 2021-05-10 | Discharge: 2021-05-10 | Disposition: A | Discharge status: Home / Self Care | Payer: Managed Care, Other (non HMO) | Source: Ambulatory Visit | Attending: Obstetrics and Gynecology | Admitting: Obstetrics and Gynecology

## 2021-05-10 ENCOUNTER — Other Ambulatory Visit: Payer: Self-pay

## 2021-05-10 DIAGNOSIS — Z1231 Encounter for screening mammogram for malignant neoplasm of breast: Secondary | ICD-10-CM | POA: Diagnosis present

## 2021-05-13 ENCOUNTER — Encounter: Payer: Self-pay | Admitting: Obstetrics and Gynecology

## 2021-07-11 ENCOUNTER — Encounter: Payer: Self-pay | Admitting: Family Medicine

## 2021-08-12 ENCOUNTER — Other Ambulatory Visit
Admission: RE | Admit: 2021-08-12 | Discharge: 2021-08-12 | Disposition: A | Discharge status: Home / Self Care | Payer: Managed Care, Other (non HMO) | Attending: Cardiology | Admitting: Cardiology

## 2021-08-12 ENCOUNTER — Other Ambulatory Visit: Payer: Self-pay

## 2021-08-12 ENCOUNTER — Ambulatory Visit (INDEPENDENT_AMBULATORY_CARE_PROVIDER_SITE_OTHER): Payer: Managed Care, Other (non HMO) | Admitting: Cardiology

## 2021-08-12 ENCOUNTER — Encounter: Payer: Self-pay | Admitting: Cardiology

## 2021-08-12 VITALS — BP 113/69 | HR 86 | Ht 61.0 in | Wt 110.0 lb

## 2021-08-12 DIAGNOSIS — R079 Chest pain, unspecified: Secondary | ICD-10-CM | POA: Diagnosis not present

## 2021-08-12 DIAGNOSIS — R072 Precordial pain: Secondary | ICD-10-CM | POA: Insufficient documentation

## 2021-08-12 DIAGNOSIS — R0600 Dyspnea, unspecified: Secondary | ICD-10-CM | POA: Diagnosis not present

## 2021-08-12 DIAGNOSIS — E7800 Pure hypercholesterolemia, unspecified: Secondary | ICD-10-CM

## 2021-08-12 LAB — BASIC METABOLIC PANEL
Anion gap: 8 (ref 5–15)
BUN: 8 mg/dL (ref 6–20)
CO2: 29 mmol/L (ref 22–32)
Calcium: 9.4 mg/dL (ref 8.9–10.3)
Chloride: 100 mmol/L (ref 98–111)
Creatinine, Ser: 0.87 mg/dL (ref 0.44–1.00)
GFR, Estimated: >60 mL/min (ref >60)
Glucose, Bld: 135 mg/dL — ABNORMAL HIGH (ref 70–99)
Potassium: 3.9 mmol/L (ref 3.5–5.1)
Sodium: 137 mmol/L (ref 135–145)

## 2021-08-12 MED ORDER — IVABRADINE HCL 5 MG PO TABS: 10.0000 mg | ORAL_TABLET | Freq: Once | ORAL | 0 refills | Status: AC

## 2021-08-12 MED ORDER — METOPROLOL TARTRATE 100 MG PO TABS: 100.0000 mg | ORAL_TABLET | Freq: Once | ORAL | 0 refills | Status: DC

## 2021-08-12 NOTE — Patient Instructions (Signed)
Medication Instructions:  Your physician recommends that you continue on your current medications as directed. Please refer to the Current Medication list given to you today.  *If you need a refill on your cardiac medications before your next appointment, please call your pharmacy*   Lab Work: BMP to be drawn at the [facility] today  If you have labs (blood work) drawn today and your tests are completely normal, you will receive your results only by: MyChart Message (if you have MyChart) OR A paper copy in the mail If you have any lab test that is abnormal or we need to change your treatment, we will call you to review the results.   Testing/Procedures:  Your physician has requested that you have an echocardiogram. Echocardiography is a painless test that uses sound waves to create images of your heart. It provides your doctor with information about the size and shape of your heart and how well your hearts chambers and valves are working. This procedure takes approximately one hour. There are no restrictions for this procedure.  Your cardiac CT will be scheduled at:  [facility] [address] [place], [place] [address] [phone]   Test date: August 19, 2021 at 11:45AM.  Please arrive 15 mins early for check-in and test prep.  Please follow these instructions carefully (unless otherwise directed):  On the Night Before the Test: Be sure to Drink plenty of water. Do not consume any caffeinated/decaffeinated beverages or chocolate 12 hours prior to your test.  On the Day of the Test: Drink plenty of water until 1 hour prior to the test. Do not eat any food 4 hours prior to the test. You may take your regular medications prior to the test.  Take medications listed below two hours prior to test. (These were sent to your pharmacy.) Metoprolol 127m  Ivabradine 175mFEMALES- please wear underwire-free bra if available, avoid  dresses & tight clothing  After the Test: Drink plenty of water. After receiving IV contrast, you may experience a mild flushed feeling. This is normal. On occasion, you may experience a mild rash up to 24 hours after the test. This is not dangerous. If this occurs, you can take Benadryl 25 mg and increase your fluid intake. If you experience trouble breathing, this can be serious. If it is severe call 911 IMMEDIATELY. If it is mild, please call our office. If you take any of these medications: Glipizide/Metformin, Avandament, Glucavance, please do not take 48 hours after completing test unless otherwise instructed.  For non-scheduling related questions, please contact the cardiac imaging nurse navigator should you have any questions/concerns: SaMarchia BondCardiac Imaging Nurse Navigator MeGordy ClementCardiac Imaging Nurse Navigator [facility] Direct Office Dial: [phone] For scheduling needs, including cancellations and rescheduling, please call [phone]   Follow-Up: [facility].  As part of our continuing mission to provide you with exceptional heart care, we have created designated Provider Care Teams.  These Care Teams include your primary Cardiologist (physician) and Advanced Practice Providers (APPs -  Physician Assistants and Nurse Practitioners) who all work together to provide you with the care you need, when you need it.  We recommend signing up for the patient portal called "MyChart".  Sign up information is provided on this After Visit Summary.  MyChart is used to connect with patients for Virtual Visits (Telemedicine).  Patients are able to  view lab/test results, encounter notes, upcoming appointments, etc.  Non-urgent messages can be sent to your provider as well.   To learn more about what you can do with MyChart, go to [email or web address].    Your next appointment:    After cardiac testing   The format for your next appointment:   In Person  Provider:   You may see Dr. Garen Lah or one of the following Advanced Practice Providers on your designated Care Team:   Murray Hodgkins, NP Christell Faith, PA-C Cadence Kathlen Mody, [place]

## 2021-08-12 NOTE — Progress Notes (Signed)
Cardiology Office Note:    Date:  08/12/2021   ID:  Dominique Valdez, DOB [birth year], MRN [id number]  PCP:  Marguerita Merles, MD   [facility] Providers Cardiologist:  None     Referring MD: Marguerita Merles, MD   No chief complaint on file.  Dominique Valdez is a 46 y.o. female who is being seen today for the evaluation of chest pain at the request of Marguerita Merles, MD.   History of Present Illness:    Dominique Valdez is a 46 y.o. female with a hx of hyperlipidemia, diabetes who presents due to chest pain.  She has symptoms of chest pain while at home about 2 months ago.  She was doing some chores at home, leaned over and had left-sided chest cramping lasting about 5 minutes.  Has not had any more chest pain, but states having shortness of breath when she overexerts herself.  She is worried since her mother has a history of coronary artery disease with CABG in her 51s.  Patient takes lisinopril for renal protection due to being a diabetic.  She denies smoking.  Past Medical History:  Diagnosis Date   Adnexal mass    Diabetes mellitus without complication (HCC)    GERD (gastroesophageal reflux disease)    Hyperlipidemia    Screening for colon cancer 04/2021   NEG Cologuard; repeat after 3 yrs   Urethral diverticulum     Past Surgical History:  Procedure Laterality Date   COLPOSCOPY  2001   normal PJR   ESOPHAGOGASTRODUODENOSCOPY  2000   [place]   excision of urethral diverticulum  02/2008   myringtomy     as a child   TYMPANOPLASTY Left 2004    Current Medications: Current Meds  Medication Sig   fexofenadine (ALLEGRA) 180 MG tablet Take 180 mg by mouth daily.   ibuprofen (ADVIL,MOTRIN) 800 MG tablet    ivabradine (CORLANOR) 5 MG TABS tablet Take 2 tablets (10 mg total) by mouth once for 1 dose. TAKE 2 HOURS PRIOR TO CT SCAN   lisinopril (PRINIVIL,ZESTRIL) 2.5 MG tablet    lovastatin (MEVACOR) 10 MG tablet Take 1 tablet by mouth daily.   metoprolol tartrate  (LOPRESSOR) 100 MG tablet Take 1 tablet (100 mg total) by mouth once for 1 dose. Take 2 hours prior to CT scan   norethindrone (MICRONOR) 0.35 MG tablet Take 1 tablet (0.35 mg total) by mouth daily.   Saxagliptin-Metformin 11-998 MG TB24 Take 1 tablet by mouth 2 (two) times daily.   VITAMIN D-1000 MAX ST 25 MCG (1000 UT) tablet Take 1,000 Units by mouth daily.     Allergies:   Bactrim [sulfamethoxazole-trimethoprim]   Social History   Socioeconomic History   Marital status: Married    Spouse name: Not on file   Number of children: 2   Years of education: Not on file   Highest education level: Not on file  Occupational History   Occupation: Patient Care Coordinator  Tobacco Use   Smoking status: Never   Smokeless tobacco: Never  Vaping Use   Vaping Use: Never used  Substance and Sexual Activity   Alcohol use: Yes    Comment: rare   Drug use: No   Sexual activity: Yes    Birth control/protection: Pill    Comment: minipill  Other Topics Concern   Not on file  Social History Narrative   Not on file   Social Determinants of Health   Financial Resource Strain: Not on file  Food Insecurity: Not on file  Transportation Needs: Not on file  Physical Activity: Not on file  Stress: Not on file  Social Connections: Not on file     Family History: The patient's family history includes Alzheimer's disease in her paternal grandfather; Coronary artery disease in her mother; Diabetes in her brother and mother; Hypertension in her father, maternal grandmother, mother, and paternal grandfather; Hyperthyroidism in her paternal grandfather; Leukemia (age of onset: 31) in her paternal grandmother; Renal Disease in her maternal grandmother. There is no history of Breast cancer.  ROS:   Please see the history of present illness.     All other systems reviewed and are negative.  EKGs/Labs/Other Studies Reviewed:    The following studies were reviewed today:   EKG:  EKG is  ordered  today.  The ekg ordered today demonstrates normal sinus rhythm, cannot rule out anterior infarct, low QRS voltage.  Recent Labs: 08/12/2021: BUN 8; Creatinine, Ser 0.87; Potassium 3.9; Sodium 137  Recent Lipid Panel No results found for: CHOL, TRIG, HDL, CHOLHDL, VLDL, LDLCALC, LDLDIRECT   Risk Assessment/Calculations:          Physical Exam:    VS:  BP 113/69    Pulse 86    Ht 5' 1"  (1.549 m)    Wt 110 lb (49.9 kg)    SpO2 98%    BMI 20.78 kg/m     Wt Readings from Last 3 Encounters:  08/12/21 110 lb (49.9 kg)  03/20/21 110 lb (49.9 kg)  02/23/20 112 lb (50.8 kg)     GEN:  Well nourished, well developed in no acute distress HEENT: Normal NECK: No JVD; No carotid bruits LYMPHATICS: No lymphadenopathy CARDIAC: RRR, no murmurs, rubs, gallops RESPIRATORY:  Clear to auscultation without rales, wheezing or rhonchi  ABDOMEN: Soft, non-tender, non-distended MUSCULOSKELETAL:  No edema; No deformity  SKIN: Warm and dry NEUROLOGIC:  Alert and oriented x 3 PSYCHIATRIC:  Normal affect   ASSESSMENT:    1. Precordial pain   2. Pure hypercholesterolemia   3. Dyspnea, unspecified type   4. Chest pain, unspecified type    PLAN:    In order of problems listed above:  Chest pain, low QRS voltage.  Risk factors hyperlipidemia, diabetes, family history of early CAD.  Get echo, get coronary CTA. Hyperlipidemia, continue atorvastatin. Dyspnea on exertion, echo and coronary CTA as above.  If cardiac work-up is normal, consider deconditioning.  Follow-up after echo and coronary CTA.       Medication Adjustments/Labs and Tests Ordered: Current medicines are reviewed at length with the patient today.  Concerns regarding medicines are outlined above.  Orders Placed This Encounter  Procedures   CT CORONARY MORPH W/CTA COR W/SCORE W/CA W/CM &/OR WO/CM   Basic Metabolic Panel (BMET)   EKG 12-Lead   ECHOCARDIOGRAM COMPLETE   Meds ordered this encounter  Medications   metoprolol  tartrate (LOPRESSOR) 100 MG tablet    Sig: Take 1 tablet (100 mg total) by mouth once for 1 dose. Take 2 hours prior to CT scan    Dispense:  1 tablet    Refill:  0   ivabradine (CORLANOR) 5 MG TABS tablet    Sig: Take 2 tablets (10 mg total) by mouth once for 1 dose. TAKE 2 HOURS PRIOR TO CT SCAN    Dispense:  2 tablet    Refill:  0    Patient Instructions  Medication Instructions:  Your physician recommends that you continue on your current medications  as directed. Please refer to the Current Medication list given to you today.  *If you need a refill on your cardiac medications before your next appointment, please call your pharmacy*   Lab Work: BMP [facility] drawn at the [facility] today  If you have labs (blood work) drawn today and your tests are completely normal, you will receive your results only by: MyChart Message (if you have MyChart) OR A paper copy in the mail If you have any lab test that is abnormal or we need to change your treatment, we will call you to review the results.   Testing/Procedures:  Your physician has requested that you have an echocardiogram. Echocardiography is a painless test that uses sound waves to create images of your heart. It provides your doctor with information about the size and shape of your heart and how well your hearts chambers and valves are working. This procedure takes approximately one hour. There are no restrictions for this procedure.  Your cardiac CT will be scheduled at:  [facility] [address] [place], [place] [address] [phone]   Test date: August 19, 2021 at 11:45AM.  Please arrive 15 mins early for check-in and test prep.  Please follow these instructions carefully (unless otherwise directed):  On the Night Before the Test: Be sure to Drink plenty of water. Do not consume any caffeinated/decaffeinated beverages or chocolate 12 hours prior to your test.  On the  Day of the Test: Drink plenty of water until 1 hour prior to the test. Do not eat any food 4 hours prior to the test. You may take your regular medications prior to the test.  Take medications listed below two hours prior to test. (These were sent to your pharmacy.) Metoprolol 148m  Ivabradine 128mFEMALES- please wear underwire-free bra if available, avoid dresses & tight clothing  After the Test: Drink plenty of water. After receiving IV contrast, you may experience a mild flushed feeling. This is normal. On occasion, you may experience a mild rash up to 24 hours after the test. This is not dangerous. If this occurs, you can take Benadryl 25 mg and increase your fluid intake. If you experience trouble breathing, this can be serious. If it is severe call 911 IMMEDIATELY. If it is mild, please call our office. If you take any of these medications: Glipizide/Metformin, Avandament, Glucavance, please do not take 48 hours after completing test unless otherwise instructed.  For non-scheduling related questions, please contact the cardiac imaging nurse navigator should you have any questions/concerns: SaMarchia BondCardiac Imaging Nurse Navigator MeGordy ClementCardiac Imaging Nurse Navigator [facility] Direct Office Dial: [phone] For scheduling needs, including cancellations and rescheduling, please call [phone]   Follow-Up: [facility] our [facility].  As part of our continuing mission to provide you with exceptional heart care, we have created designated Provider Care Teams.  These Care Teams include your primary Cardiologist (physician) and Advanced Practice Providers (APPs -  Physician Assistants and Nurse Practitioners) who all work together to provide you with the care you need, when you need it.  We recommend signing up for the patient portal called "MyChart".  Sign up information is provided on  this After Visit Summary.  MyChart is used to connect with patients for Virtual Visits (Telemedicine).  Patients are able to view lab/test results, encounter notes, upcoming appointments, etc.  Non-urgent messages can  be sent to your provider as well.   To learn more about what you can do with MyChart, go to [email or web address].    Your next appointment:   After cardiac testing   The format for your next appointment:   In Person  Provider:   You may see Dr. Garen Lah or one of the following Advanced Practice Providers on your designated Care Team:   Murray Hodgkins, NP Christell Faith, PA-C Cadence Kathlen Mody, [place]     Signed, Kate Sable, MD  08/12/2021 5:20 PM    [place]

## 2021-08-14 ENCOUNTER — Ambulatory Visit (INDEPENDENT_AMBULATORY_CARE_PROVIDER_SITE_OTHER): Payer: Managed Care, Other (non HMO)

## 2021-08-14 ENCOUNTER — Other Ambulatory Visit: Payer: Self-pay

## 2021-08-14 DIAGNOSIS — R0600 Dyspnea, unspecified: Secondary | ICD-10-CM | POA: Diagnosis not present

## 2021-08-14 DIAGNOSIS — R079 Chest pain, unspecified: Secondary | ICD-10-CM | POA: Diagnosis not present

## 2021-08-14 DIAGNOSIS — R072 Precordial pain: Secondary | ICD-10-CM | POA: Diagnosis not present

## 2021-08-14 LAB — ECHOCARDIOGRAM COMPLETE
AR max vel: 2.3 cm2
AV Area VTI: 1.93 cm2
AV Area mean vel: 1.96 cm2
AV Mean grad: 2 mmHg
AV Peak grad: 3.5 mmHg
Ao pk vel: 0.94 m/s
Area-P 1/2: 4.54 cm2
S' Lateral: 2.7 cm

## 2021-08-16 ENCOUNTER — Telehealth (HOSPITAL_COMMUNITY): Payer: Self-pay | Admitting: *Deleted

## 2021-08-16 NOTE — Telephone Encounter (Signed)
Attempted to call patient regarding upcoming cardiac CT appointment. °Left message on voicemail with name and callback number ° °Kyson Kupper RN Navigator Cardiac Imaging °[facility] °[phone] Office °[phone] Cell ° °

## 2021-08-19 ENCOUNTER — Ambulatory Visit
Admission: RE | Admit: 2021-08-19 | Discharge: 2021-08-19 | Disposition: A | Discharge status: Home / Self Care | Payer: Managed Care, Other (non HMO) | Source: Ambulatory Visit | Attending: Cardiology | Admitting: Cardiology

## 2021-08-19 ENCOUNTER — Other Ambulatory Visit: Payer: Self-pay

## 2021-08-19 DIAGNOSIS — R079 Chest pain, unspecified: Secondary | ICD-10-CM | POA: Diagnosis present

## 2021-08-19 MED ORDER — METOPROLOL TARTRATE 5 MG/5ML IV SOLN
10.0000 mg | Freq: Once | INTRAVENOUS | Status: AC
  Administered 2021-08-19: 10 mg via INTRAVENOUS

## 2021-08-19 MED ORDER — NITROGLYCERIN 0.4 MG SL SUBL
0.8000 mg | SUBLINGUAL_TABLET | Freq: Once | SUBLINGUAL | Status: AC
  Administered 2021-08-19: 0.8 mg via SUBLINGUAL

## 2021-08-19 MED ORDER — IOHEXOL 350 MG/ML SOLN
75.0000 mL | Freq: Once | INTRAVENOUS | Status: AC | PRN
  Administered 2021-08-19: 75 mL via INTRAVENOUS

## 2021-08-20 ENCOUNTER — Telehealth: Payer: Self-pay

## 2021-08-20 NOTE — Telephone Encounter (Signed)
-----   Message from Kate Sable, MD sent at 08/16/2021  1:10 PM EST ----- Echo shows normal systolic and diastolic function, no findings to suggest etiology of chest pain.

## 2021-08-20 NOTE — Telephone Encounter (Signed)
The patient has been notified of the result and verbalized understanding.  All questions (if any) were answered. Kavin Leech, RN 08/20/2021 12:01 PM

## 2021-08-20 NOTE — Telephone Encounter (Signed)
Called and left a VM for patient requesting a call back so that I could give her both of the result notes below.   Normal coronary CTA, no evidence for CAD.  2.    Echo shows normal systolic and diastolic function, no findings to suggest etiology of chest pain.

## 2021-08-30 ENCOUNTER — Ambulatory Visit: Payer: Managed Care, Other (non HMO) | Admitting: Cardiology

## 2021-09-04 ENCOUNTER — Other Ambulatory Visit: Payer: Self-pay

## 2021-09-04 ENCOUNTER — Ambulatory Visit (INDEPENDENT_AMBULATORY_CARE_PROVIDER_SITE_OTHER): Payer: Managed Care, Other (non HMO) | Admitting: Gastroenterology

## 2021-09-04 ENCOUNTER — Encounter: Payer: Self-pay | Admitting: Gastroenterology

## 2021-09-04 VITALS — BP 130/87 | HR 84 | Temp 98.7°F | Wt 111.2 lb

## 2021-09-04 DIAGNOSIS — R194 Change in bowel habit: Secondary | ICD-10-CM

## 2021-09-04 DIAGNOSIS — R140 Abdominal distension (gaseous): Secondary | ICD-10-CM

## 2021-09-04 MED ORDER — NA SULFATE-K SULFATE-MG SULF 17.5-3.13-1.6 GM/177ML PO SOLN: 354.0000 mL | Freq: Once | ORAL | 0 refills | Status: AC

## 2021-09-04 NOTE — Progress Notes (Signed)
Cephas Darby, MD [address]  [place]  [place], [place] [address]  Main: [phone]  Fax: [phone]    Gastroenterology Consultation  Referring Provider:     Marguerita Merles, MD Primary Care Physician:  Marguerita Merles, MD Primary Gastroenterologist:  Dr. Cephas Darby Reason for Consultation:     Change in bowel habits, abdominal bloating        HPI:   Dominique Valdez is a 46 y.o. female referred by Dr. Lennox Grumbles, Connye Burkitt, MD  for consultation & management of change in bowel habits.  Patient reports that about an year ago she was suffering from severe constipation, resulted in symptomatic external hemorrhoids, severe bleeding which has resolved.  She has started taking MiraLAX 1-2 times daily which resulted in soft stool.  However, patient reports abdominal discomfort and bloating worse towards the end of the day.  She denies any particular relation to food.  She does acknowledge drinking sweetened green tea daily.  She does have diabetes, hemoglobin A1c 6.6.  No evidence of anemia based on labs from December 2022.  She denies any weight loss.  She had first cousin passed away from stage IV colon cancer at age 41.  She had Cologuard test done in 2022 which was negative.  She works at the Engineer, [employer] at [employer]  NSAIDs: None  Antiplts/Anticoagulants/Anti thrombotics: None  GI Procedures: None  Past Medical History:  Diagnosis Date   Adnexal mass    Diabetes mellitus without complication ([place])    GERD (gastroesophageal reflux disease)    Hyperlipidemia    Screening for colon cancer 04/2021   NEG Cologuard; repeat after 3 yrs   Urethral diverticulum     Past Surgical History:  Procedure Laterality Date   COLPOSCOPY  2001   normal [facility]   ESOPHAGOGASTRODUODENOSCOPY  2000   [place]   excision of urethral diverticulum  02/2008   myringtomy     as a child   TYMPANOPLASTY Left 2004   Current Outpatient Medications:    CORLANOR 5 MG TABS tablet,  Take 10 mg by mouth once., Disp: , Rfl:    fexofenadine (ALLEGRA) 180 MG tablet, Take 180 mg by mouth daily., Disp: , Rfl:    ibuprofen (ADVIL,MOTRIN) 800 MG tablet, , Disp: , Rfl: 5   lisinopril (PRINIVIL,ZESTRIL) 2.5 MG tablet, , Disp: , Rfl: 10   lovastatin (MEVACOR) 10 MG tablet, Take 1 tablet by mouth daily., Disp: , Rfl:    Na Sulfate-K Sulfate-Mg Sulf 17.5-3.13-1.6 GM/177ML SOLN, Take 354 mLs by mouth once for 1 dose., Disp: 354 mL, Rfl: 0   norethindrone (MICRONOR) 0.35 MG tablet, Take 1 tablet (0.35 mg total) by mouth daily., Disp: 84 tablet, Rfl: 3   Saxagliptin-Metformin 11-998 MG TB24, Take 1 tablet by mouth 2 (two) times daily., Disp: , Rfl:    triamcinolone cream (KENALOG) 0.1 %, Apply topically., Disp: , Rfl:    VITAMIN D-1000 MAX ST 25 MCG (1000 UT) tablet, Take 1,000 Units by mouth daily., Disp: , Rfl:    Family History  Problem Relation Age of Onset   Coronary artery disease Mother    Diabetes Mother        Type 2   Hypertension Mother    Hypertension Father    Diabetes Brother        Type 2   Hypertension Maternal Grandmother    Renal Disease Maternal Grandmother    Leukemia Paternal Grandmother 87   Hypertension  Paternal Grandfather    Alzheimer's disease Paternal Grandfather    Hyperthyroidism Paternal Grandfather    Breast cancer Neg Hx      Social History   Tobacco Use   Smoking status: Never   Smokeless tobacco: Never  Vaping Use   Vaping Use: Never used  Substance Use Topics   Alcohol use: Yes    Comment: rare   Drug use: No    Allergies as of 09/04/2021 - Review Complete 09/04/2021  Allergen Reaction Noted   Bactrim [sulfamethoxazole-trimethoprim] Rash 12/05/2015    Review of Systems:    All systems reviewed and negative except where noted in HPI.   Physical Exam:  BP 130/87 (BP Location: Left Arm, Patient Position: Sitting, Cuff Size: Normal)    Pulse 84    Temp 98.7 F (37.1 C) (Oral)    Wt 111 lb 4 oz (50.5 kg)    BMI 21.02 kg/m  No  LMP recorded. (Menstrual status: Oral contraceptives).  General:   Alert,  Well-developed, well-nourished, pleasant and cooperative in NAD Head:  Normocephalic and atraumatic. Eyes:  Sclera clear, no icterus.   Conjunctiva pink. Ears:  Normal auditory acuity. Nose:  No deformity, discharge, or lesions. Mouth:  No deformity or lesions,oropharynx pink & moist. Neck:  Supple; no masses or thyromegaly. Lungs:  Respirations even and unlabored.  Clear throughout to auscultation.   No wheezes, crackles, or rhonchi. No acute distress. Heart:  Regular rate and rhythm; no murmurs, clicks, rubs, or gallops. Abdomen:  Normal bowel sounds. Soft, non-tender and mildly distended, tympanic to percussion without masses, hepatosplenomegaly or hernias noted.  No guarding or rebound tenderness.   Rectal: Not performed Msk:  Symmetrical without gross deformities. Good, equal movement & strength bilaterally. Pulses:  Normal pulses noted. Extremities:  No clubbing or edema.  No cyanosis. Neurologic:  Alert and oriented x3;  grossly normal neurologically. Skin:  Intact without significant lesions or rashes. No jaundice. Psych:  Alert and cooperative. Normal mood and affect.  Imaging Studies: Reviewed  Assessment and Plan:   Dominique Valdez is a 46 y.o. Hispanic female with prior history of constipation, now with loose stools, abdominal bloating, upper abdominal discomfort, family history of colon cancer in first cousin at age 7  Abdominal bloating with change in bowel habits Recommend colonoscopy H. pylori breath test Advised patient to stop sweetened drinks Okay to take MiraLAX Discussed about high-fiber diet, information provided   Follow up after the above work-up, contact via MyChart as needed   Cephas Darby, MD

## 2021-09-04 NOTE — Patient Instructions (Signed)
High-Fiber Eating Plan °Fiber, also called dietary fiber, is a type of carbohydrate. It is found foods such as fruits, vegetables, whole grains, and beans. A high-fiber diet can have many health benefits. Your health care provider may recommend a high-fiber diet to help: °Prevent constipation. Fiber can make your bowel movements more regular. °Lower your cholesterol. °Relieve the following conditions: °Inflammation of veins in the anus (hemorrhoids). °Inflammation of specific areas of the digestive tract (uncomplicated diverticulosis). °A problem of the large intestine, also called the colon, that sometimes causes pain and diarrhea (irritable bowel syndrome, or IBS). °Prevent overeating as part of a weight-loss plan. °Prevent heart disease, type 2 diabetes, and certain cancers. °What are tips for following this plan? °Reading food labels ° °Check the nutrition facts label on food products for the amount of dietary fiber. Choose foods that have 5 grams of fiber or more per serving. °The goals for recommended daily fiber intake include: °Men (age 50 or younger): 34-38 g. °Men (over age 50): 28-34 g. °Women (age 50 or younger): 25-28 g. °Women (over age 50): 22-25 g. °Your daily fiber goal is _____________ g. °Shopping °Choose whole fruits and vegetables instead of processed forms, such as apple juice or applesauce. °Choose a wide variety of high-fiber foods such as avocados, lentils, oats, and kidney beans. °Read the nutrition facts label of the foods you choose. Be aware of foods with added fiber. These foods often have high sugar and sodium amounts per serving. °Cooking °Use whole-grain flour for baking and cooking. °Cook with brown rice instead of white rice. °Meal planning °Start the day with a breakfast that is high in fiber, such as a cereal that contains 5 g of fiber or more per serving. °Eat breads and cereals that are made with whole-grain flour instead of refined flour or white flour. °Eat brown rice, bulgur  wheat, or millet instead of white rice. °Use beans in place of meat in soups, salads, and pasta dishes. °Be sure that half of the grains you eat each day are whole grains. °General information °You can get the recommended daily intake of dietary fiber by: °Eating a variety of fruits, vegetables, grains, nuts, and beans. °Taking a fiber supplement if you are not able to take in enough fiber in your diet. It is better to get fiber through food than from a supplement. °Gradually increase how much fiber you consume. If you increase your intake of dietary fiber too quickly, you may have bloating, cramping, or gas. °Drink plenty of water to help you digest fiber. °Choose high-fiber snacks, such as berries, raw vegetables, nuts, and popcorn. °What foods should I eat? °Fruits °Berries. Pears. Apples. Oranges. Avocado. Prunes and raisins. Dried figs. °Vegetables °Sweet potatoes. Spinach. Kale. Artichokes. Cabbage. Broccoli. Cauliflower. Green peas. Carrots. Squash. °Grains °Whole-grain breads. Multigrain cereal. Oats and oatmeal. Brown rice. Barley. Bulgur wheat. Millet. Quinoa. Bran muffins. Popcorn. Rye wafer crackers. °Meats and other proteins °Navy beans, kidney beans, and pinto beans. Soybeans. Split peas. Lentils. Nuts and seeds. °Dairy °Fiber-fortified yogurt. °Beverages °Fiber-fortified soy milk. Fiber-fortified orange juice. °Other foods °Fiber bars. °The items listed above may not be a complete list of recommended foods and beverages. Contact a dietitian for more information. °What foods should I avoid? °Fruits °Fruit juice. Cooked, strained fruit. °Vegetables °Fried potatoes. Canned vegetables. Well-cooked vegetables. °Grains °White bread. Pasta made with refined flour. White rice. °Meats and other proteins °Fatty cuts of meat. Fried chicken or fried fish. °Dairy °Milk. Yogurt. Cream cheese. Sour cream. °Fats and   oils °Butters. °Beverages °Soft drinks. °Other foods °Cakes and pastries. °The items listed above may  not be a complete list of foods and beverages to avoid. Talk with your dietitian about what choices are best for you. °Summary °Fiber is a type of carbohydrate. It is found in foods such as fruits, vegetables, whole grains, and beans. °A high-fiber diet has many benefits. It can help to prevent constipation, lower blood cholesterol, aid weight loss, and reduce your risk of heart disease, diabetes, and certain cancers. °Increase your intake of fiber gradually. Increasing fiber too quickly may cause cramping, bloating, and gas. Drink plenty of water while you increase the amount of fiber you consume. °The best sources of fiber include whole fruits and vegetables, whole grains, nuts, seeds, and beans. °This information is not intended to replace advice given to you by your health care provider. Make sure you discuss any questions you have with your health care provider. °Document Revised: 11/03/2019 Document Reviewed: 11/03/2019 °Elsevier Patient Education © 2022 Elsevier Inc. ° °

## 2021-09-06 LAB — H. PYLORI BREATH TEST: H pylori Breath Test: NEGATIVE

## 2021-10-11 ENCOUNTER — Ambulatory Visit: Payer: Managed Care, Other (non HMO) | Admitting: Anesthesiology

## 2021-10-11 ENCOUNTER — Ambulatory Visit
Admission: RE | Admit: 2021-10-11 | Discharge: 2021-10-11 | Disposition: A | Discharge status: Home / Self Care | Payer: Managed Care, Other (non HMO) | Attending: Gastroenterology | Admitting: Gastroenterology

## 2021-10-11 ENCOUNTER — Encounter
Admission: RE | Disposition: A | Discharge status: Home / Self Care | Payer: Self-pay | Source: Home / Self Care | Attending: Gastroenterology

## 2021-10-11 DIAGNOSIS — K644 Residual hemorrhoidal skin tags: Secondary | ICD-10-CM

## 2021-10-11 DIAGNOSIS — R194 Change in bowel habit: Secondary | ICD-10-CM

## 2021-10-11 DIAGNOSIS — E119 Type 2 diabetes mellitus without complications: Secondary | ICD-10-CM | POA: Insufficient documentation

## 2021-10-11 HISTORY — PX: COLONOSCOPY WITH PROPOFOL: SHX5780

## 2021-10-11 LAB — GLUCOSE, CAPILLARY: Glucose-Capillary: 121 mg/dL — ABNORMAL HIGH (ref 70–99)

## 2021-10-11 LAB — POCT PREGNANCY, URINE: Preg Test, Ur: NEGATIVE

## 2021-10-11 SURGERY — COLONOSCOPY WITH PROPOFOL
Anesthesia: General

## 2021-10-11 MED ORDER — PROPOFOL 10 MG/ML IV BOLUS
INTRAVENOUS | Status: DC | PRN
  Administered 2021-10-11: 70 mg via INTRAVENOUS

## 2021-10-11 MED ORDER — PROPOFOL 500 MG/50ML IV EMUL
INTRAVENOUS | Status: DC | PRN
Start: 2021-10-11 — End: 2021-10-11
  Administered 2021-10-11: 140 ug/kg/min via INTRAVENOUS

## 2021-10-11 MED ORDER — SODIUM CHLORIDE 0.9 % IV SOLN
INTRAVENOUS | Status: DC
  Administered 2021-10-11: 20 mL/h via INTRAVENOUS

## 2021-10-11 NOTE — Op Note (Signed)
[facility] ?Gastroenterology ?Patient Name: Dominique Valdez ?Procedure Date: 10/11/2021 10:41 AM ?MRN: [id number] ?Account #: [id number] ?Date of Birth: July 01, 1976 ?Admit Type: Outpatient ?Age: 46 ?Room: [facility] ENDO ROOM 4 ?Gender: Female ?Note Status: Finalized ?Instrument Name: Peds Colonoscope [id number] ?Procedure:             Colonoscopy ?Indications:           This is the patient's first colonoscopy, Change in  ?                       bowel habits ?Providers:             Lin Landsman MD, MD ?Medicines:             General Anesthesia ?Complications:         No immediate complications. Estimated blood loss: None. ?Procedure:             Pre-Anesthesia Assessment: ?                       - Prior to the procedure, a History and Physical was  ?                       performed, and patient medications and allergies were  ?                       reviewed. The patient is competent. The risks and  ?                       benefits of the procedure and the sedation options and  ?                       risks were discussed with the patient. All questions  ?                       were answered and informed consent was obtained.  ?                       Patient identification and proposed procedure were  ?                       verified by the physician, the nurse, the  ?                       anesthesiologist, the anesthetist and the technician  ?                       in the pre-procedure area in the procedure room in the  ?                       endoscopy suite. Mental Status Examination: alert and  ?                       oriented. Airway Examination: normal oropharyngeal  ?                       airway and neck mobility. Respiratory Examination:  ?                       clear to auscultation.  CV Examination: normal.  ?                       Prophylactic Antibiotics: The patient does not require  ?                       prophylactic antibiotics. Prior Anticoagulants: The  ?                        patient has taken no previous anticoagulant or  ?                       antiplatelet agents. ASA Grade Assessment: II - A  ?                       patient with mild systemic disease. After reviewing  ?                       the risks and benefits, the patient was deemed in  ?                       satisfactory condition to undergo the procedure. The  ?                       anesthesia plan was to use general anesthesia.  ?                       Immediately prior to administration of medications,  ?                       the patient was re-assessed for adequacy to receive  ?                       sedatives. The heart rate, respiratory rate, oxygen  ?                       saturations, blood pressure, adequacy of pulmonary  ?                       ventilation, and response to care were monitored  ?                       throughout the procedure. The physical status of the  ?                       patient was re-assessed after the procedure. ?                       After obtaining informed consent, the colonoscope was  ?                       passed under direct vision. Throughout the procedure,  ?                       the patient's blood pressure, pulse, and oxygen  ?                       saturations were monitored continuously. The  ?  Colonoscope was introduced through the anus and  ?                       advanced to the the terminal ileum, with  ?                       identification of the appendiceal orifice and IC  ?                       valve. The colonoscopy was performed without  ?                       difficulty. The patient tolerated the procedure well.  ?                       The quality of the bowel preparation was evaluated  ?                       using the BBPS [facility] Bowel Preparation Scale) with  ?                       scores of: Right Colon = 3, Transverse Colon = 3 and  ?                       Left Colon = 3 (entire mucosa seen well with no  ?                        residual staining, small fragments of stool or opaque  ?                       liquid). The total BBPS score equals 9. ?Findings: ?     Skin tags were found on perianal exam. ?     The terminal ileum appeared normal. ?     Non-bleeding external hemorrhoids were found during retroflexion. The  ?     hemorrhoids were large. ?     The exam was otherwise without abnormality. ?Impression:            - Perianal skin tags found on perianal exam. ?                       - The examined portion of the ileum was normal. ?                       - Non-bleeding external hemorrhoids. ?                       - The examination was otherwise normal. ?                       - No specimens collected. ?Recommendation:        - Discharge patient to home (with escort). ?                       - Resume previous diet today. ?                       - Continue present medications. ?                       -  Repeat colonoscopy in 10 years for screening  ?                       purposes. ?                       - Return to my office in 2 weeks for hemorrhoid  ?                       banding. ?Procedure Code(s):     --- Professional --- ?                       [phone], Colonoscopy, flexible; diagnostic, including  ?                       collection of specimen(s) by brushing or washing, when  ?                       performed (separate procedure) ?Diagnosis Code(s):     --- Professional --- ?                       K64.4, Residual hemorrhoidal skin tags ?                       R19.4, Change in bowel habit ?CPT copyright 2019 American Medical Association. All rights reserved. ?The codes documented in this report are preliminary and upon coder review may  ?be revised to meet current compliance requirements. ?Dr. Ulyess Mort ?Nehemyah Foushee Raeanne Gathers MD, MD ?10/11/2021 11:04:40 AM ?This report has been signed electronically. ?Number of Addenda: 0 ?Note Initiated On: 10/11/2021 10:41 AM ?Scope Withdrawal Time: 0 hours 8 minutes 19 seconds  ?Total Procedure  Duration: 0 hours 10 minutes 10 seconds  ?Estimated Blood Loss:  Estimated blood loss: none. ?     [facility] ?

## 2021-10-11 NOTE — Transfer of Care (Signed)
Immediate Anesthesia Transfer of Care Note ? ?Patient: Dominique Valdez ? ?Procedure(s) Performed: COLONOSCOPY WITH PROPOFOL ? ?Patient Location: PACU ? ?Anesthesia Type:General ? ?Level of Consciousness: awake, alert  and oriented ? ?Airway & Oxygen Therapy: Patient Spontanous Breathing ? ?Post-op Assessment: Report given to RN and Post -op Vital signs reviewed and stable ? ?Post vital signs: Reviewed and stable ? ?Last Vitals:  ?Vitals Value Taken Time  ?BP 114/91 10/11/21 1106  ?Temp    ?Pulse 94 10/11/21 1106  ?Resp 17 10/11/21 1106  ?SpO2 98 % 10/11/21 1106  ?Vitals shown include unvalidated device data. ? ?Last Pain:  ?Vitals:  ? 10/11/21 0906  ?TempSrc: Temporal  ?PainSc: 0-No pain  ?   ? ?  ? ?Complications: No notable events documented. ?

## 2021-10-11 NOTE — H&P (Signed)
?Cephas Darby, MD ?[address]  ?[address]  ?[place], [place] [address]  ?Main: [phone]  ?Fax: [phone] ?Pager: [phone] ? ?Primary Care Physician:  Marguerita Merles, MD ?Primary Gastroenterologist:  Dr. Cephas Darby ? ?Pre-Procedure History & Physical: ?HPI:  Dominique Valdez is a 46 y.o. female is here for an colonoscopy. ?  ?Past Medical History:  ?Diagnosis Date  ? Adnexal mass   ? Diabetes mellitus without complication ([place])   ? GERD (gastroesophageal reflux disease)   ? Hyperlipidemia   ? Screening for colon cancer 04/2021  ? NEG Cologuard; repeat after 3 yrs  ? Urethral diverticulum   ? ? ?Past Surgical History:  ?Procedure Laterality Date  ? COLPOSCOPY  2001  ? normal [facility]  ? ESOPHAGOGASTRODUODENOSCOPY  2000  ? [facility]  ? excision of urethral diverticulum  02/2008  ? myringtomy    ? as a child  ? TYMPANOPLASTY Left 2004  ? ? ?Prior to Admission medications   ?Medication Sig Start Date End Date Taking? Authorizing Provider  ?CORLANOR 5 MG TABS tablet Take 10 mg by mouth once. 08/12/21  Yes [provider]  ?fexofenadine (ALLEGRA) 180 MG tablet Take 180 mg by mouth daily.   Yes [provider]  ?ibuprofen (ADVIL,MOTRIN) 800 MG tablet  12/11/17  Yes [provider]  ?lisinopril (PRINIVIL,ZESTRIL) 2.5 MG tablet  11/26/16  Yes [provider]  ?lovastatin (MEVACOR) 10 MG tablet Take 1 tablet by mouth daily. 01/13/20  Yes [provider]  ?norethindrone (MICRONOR) 0.35 MG tablet Take 1 tablet (0.35 mg total) by mouth daily. 07/20/38  Yes Copland, Deirdre Evener, PA-C  ?Saxagliptin-Metformin 11-998 MG TB24 Take 1 tablet by mouth 2 (two) times daily.   Yes [provider]  ?triamcinolone cream (KENALOG) 0.1 % Apply topically. 08/28/21  Yes [provider]  ?VITAMIN D-1000 MAX ST 25 MCG (1000 UT) tablet Take 1,000 Units by mouth daily. 07/01/21  Yes [provider]  ? ? ?Allergies as of 09/04/2021 - Review Complete 09/04/2021  ?Allergen  Reaction Noted  ? Bactrim [sulfamethoxazole-trimethoprim] Rash 12/05/2015  ? ? ?Family History  ?Problem Relation Age of Onset  ? Coronary artery disease Mother   ? Diabetes Mother   ?     Type 2  ? Hypertension Mother   ? Hypertension Father   ? Diabetes Brother   ?     Type 2  ? Hypertension Maternal Grandmother   ? Renal Disease Maternal Grandmother   ? Leukemia Paternal Grandmother 30  ? Hypertension Paternal Grandfather   ? Alzheimer's disease Paternal Grandfather   ? Hyperthyroidism Paternal Grandfather   ? Breast cancer Neg Hx   ? ? ?Social History  ? ?Socioeconomic History  ? Marital status: Married  ?  Spouse name: Not on file  ? Number of children: 2  ? Years of education: Not on file  ? Highest education level: Not on file  ?Occupational History  ? Occupation: Patient Care Coordinator  ?Tobacco Use  ? Smoking status: Never  ? Smokeless tobacco: Never  ?Vaping Use  ? Vaping Use: Never used  ?Substance and Sexual Activity  ? Alcohol use: Yes  ?  Comment: rare  ? Drug use: No  ? Sexual activity: Yes  ?  Birth control/protection: Pill  ?  Comment: minipill  ?Other Topics Concern  ? Not on file  ?Social History Narrative  ? Not on file  ? ?Social Determinants of Health  ? ?Financial Resource Strain: Not on  file  ?Food Insecurity: Not on file  ?Transportation Needs: Not on file  ?Physical Activity: Not on file  ?Stress: Not on file  ?Social Connections: Not on file  ?Intimate Partner Violence: Not on file  ? ? ?Review of Systems: ?See HPI, otherwise negative ROS ? ?Physical Exam: ?BP (!) 120/94   Pulse 94   Temp (!) 96.9 ?F (36.1 ?C) (Temporal)   Resp 20   Ht 5' 1"  (1.549 m)   Wt 48.5 kg   SpO2 100%   BMI 20.22 kg/m?  ?General:   Alert,  pleasant and cooperative in NAD ?Head:  Normocephalic and atraumatic. ?Neck:  Supple; no masses or thyromegaly. ?Lungs:  Clear throughout to auscultation.    ?Heart:  Regular rate and rhythm. ?Abdomen:  Soft, nontender and nondistended. Normal bowel sounds, without  guarding, and without rebound.   ?Neurologic:  Alert and  oriented x4;  grossly normal neurologically. ? ?Impression/Plan: ?Dominique Valdez is here for an colonoscopy to be performed for change in bowel habits ? ?Risks, benefits, limitations, and alternatives regarding  colonoscopy have been reviewed with the patient.  Questions have been answered.  All parties agreeable. ? ? ?Sherri Sear, MD  10/11/2021, 9:49 AM ?

## 2021-10-11 NOTE — Anesthesia Preprocedure Evaluation (Addendum)
Anesthesia Evaluation  ?Patient identified by MRN, date of birth, ID band ?Patient awake ? ? ? ?Reviewed: ?Allergy & Precautions, NPO status , Patient's Chart, lab work & pertinent test results ? ?Airway ?Mallampati: II ? ?TM Distance: >3 FB ?Neck ROM: full ? ? ? Dental ?no notable dental hx. ? ?  ?Pulmonary ?neg pulmonary ROS,  ?  ?Pulmonary exam normal ? ? ? ? ? ? ? Cardiovascular ?negative cardio ROS ?Normal cardiovascular exam ? ?Evaluated by a cardiologist for chest pain and dyspnea 07/2021. ? ?Patients Echo and CCTA were normal. Patient wanted to cancel her follow up as she stated she is no longer having any chest pain or shortness of breath. ?  ?Neuro/Psych ?negative neurological ROS ? negative psych ROS  ? GI/Hepatic ?Neg liver ROS, GERD  ,  ?Endo/Other  ?diabetes, Type 2 ? Renal/GU ?negative Renal ROS  ?negative genitourinary ?  ?Musculoskeletal ? ? Abdominal ?Normal abdominal exam  (+)   ?Peds ? Hematology ?negative hematology ROS ?(+)   ?Anesthesia Other Findings ?Past Medical History: ?No date: Adnexal mass ?No date: Diabetes mellitus without complication ([place]) ?No date: GERD (gastroesophageal reflux disease) ?No date: Hyperlipidemia ?04/2021: Screening for colon cancer ?    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 ?Past Surgical History: ?2001: COLPOSCOPY ?    Comment:  normal PJR ?2000: ESOPHAGOGASTRODUODENOSCOPY ?    Comment:  [facility] ?02/2008: excision of urethral diverticulum ?No date: myringtomy ?    Comment:  as a child ?2004: TYMPANOPLASTY; Left ? ? ? ? Reproductive/Obstetrics ?negative OB ROS ? ?  ? ? ? ? ? ? ? ? ? ? ? ? ? ?  ?  ? ? ? ? ? ? ? ?Anesthesia Physical ?Anesthesia Plan ? ?ASA: 2 ? ?Anesthesia Plan: General  ? ?Post-op Pain Management:   ? ?Induction: Intravenous ? ?PONV Risk Score and Plan: Propofol infusion and TIVA ? ?Airway Management Planned: Natural Airway and Simple Face Mask ? ?Additional Equipment:   ? ?Intra-op Plan:  ? ?Post-operative Plan:  ? ?Informed Consent: I have reviewed the patients History and Physical, chart, labs and discussed the procedure including the risks, benefits and alternatives for the proposed anesthesia with the patient or authorized representative who has indicated his/her understanding and acceptance.  ? ? ? ?Dental advisory given ? ?Plan Discussed with: Anesthesiologist, CRNA and Surgeon ? ?Anesthesia Plan Comments:   ? ? ? ? ? ?Anesthesia Quick Evaluation ? ?

## 2021-10-11 NOTE — Anesthesia Postprocedure Evaluation (Signed)
Anesthesia Post Note ? ?Patient: Dominique Valdez ? ?Procedure(s) Performed: COLONOSCOPY WITH PROPOFOL ? ?Patient location during evaluation: PACU ?Anesthesia Type: General ?Level of consciousness: awake and alert ?Pain management: pain level controlled ?Vital Signs Assessment: post-procedure vital signs reviewed and stable ?Respiratory status: spontaneous breathing, nonlabored ventilation and respiratory function stable ?Cardiovascular status: blood pressure returned to baseline and stable ?Postop Assessment: no apparent nausea or vomiting ?Anesthetic complications: no ? ? ?No notable events documented. ? ? ?Last Vitals:  ?Vitals:  ? 10/11/21 1115 10/11/21 1125  ?BP: 99/83 114/88  ?Pulse: 75 75  ?Resp: 17 13  ?Temp:    ?SpO2: 100% 100%  ?  ?Last Pain:  ?Vitals:  ? 10/11/21 1125  ?TempSrc:   ?PainSc: 0-No pain  ? ? ?  ?  ?  ?  ?  ?  ? ?[place] Ouch ? ? ? ? ?

## 2021-10-14 ENCOUNTER — Encounter: Payer: Self-pay | Admitting: Gastroenterology

## 2021-10-14 ENCOUNTER — Telehealth: Payer: Self-pay

## 2021-10-14 NOTE — Telephone Encounter (Signed)
Called and left a message for call back  

## 2021-10-14 NOTE — Telephone Encounter (Signed)
-----   Message from Lin Landsman, MD sent at 10/11/2021 11:42 AM EDT ----- ?Regarding: banding ?Please bring her in 2 weeks for hemorrhoid banding ? ?Thanks ?RV ? ?

## 2021-10-15 NOTE — Telephone Encounter (Signed)
Got patient schedule for 10/30/21 at 3:00 ?

## 2021-10-30 ENCOUNTER — Ambulatory Visit: Payer: Managed Care, Other (non HMO) | Admitting: Gastroenterology

## 2021-11-20 ENCOUNTER — Encounter: Payer: Self-pay | Admitting: Gastroenterology

## 2021-11-20 ENCOUNTER — Ambulatory Visit (INDEPENDENT_AMBULATORY_CARE_PROVIDER_SITE_OTHER): Payer: Managed Care, Other (non HMO) | Admitting: Gastroenterology

## 2021-11-20 VITALS — BP 112/75 | HR 81 | Temp 98.9°F | Ht 61.0 in | Wt 110.5 lb

## 2021-11-20 DIAGNOSIS — K640 First degree hemorrhoids: Secondary | ICD-10-CM

## 2021-11-20 NOTE — Progress Notes (Signed)
?  ?Cephas Darby, MD ?[address]  ?[address]  ?[place], [place] [address]  ?Main: [phone]  ?Fax: [phone] ? ? ? ?Gastroenterology Consultation ? ?Referring Provider:     Marguerita Merles, MD ?Primary Care Physician:  Marguerita Merles, MD ?Primary Gastroenterologist:  Dr. Cephas Darby ?Reason for Consultation:     Change in bowel habits, abdominal bloating ?      ? HPI:   ?Dominique Valdez is a 46 y.o. female referred by Dr. Lennox Grumbles, Connye Burkitt, MD  for consultation & management of change in bowel habits.  Patient reports that about an year ago she was suffering from severe constipation, resulted in symptomatic external hemorrhoids, severe bleeding which has resolved.  She has started taking MiraLAX 1-2 times daily which resulted in soft stool.  However, patient reports abdominal discomfort and bloating worse towards the end of the day.  She denies any particular relation to food.  She does acknowledge drinking sweetened green tea daily.  She does have diabetes, hemoglobin A1c 6.6.  No evidence of anemia based on labs from December 2022.  She denies any weight loss.  She had first cousin passed away from stage IV colon cancer at age 53.  She had Cologuard test done in 2022 which was negative. ? ?She works at the Engineer, [employer] at [employer] ? ?Follow-up visit 11/20/2021 ?Patient underwent colonoscopy which was unremarkable except for hemorrhoids.  Her H. pylori breath test was negative.  Patient reports that she is trying to eat healthy, incorporating more fiber in her diet and her GI symptoms including constipation and bloating are improving.  She does have rectal pressure and discomfort, stinging sensation when she has a bowel movement.  She is interested in hemorrhoid ligation ? ?NSAIDs: None ? ?Antiplts/Anticoagulants/Anti thrombotics: None ? ?GI Procedures:  ?Colonoscopy 10/11/2021 ?- Perianal skin tags found on perianal exam. ?- The examined portion of the ileum was normal. ?- Non-bleeding  external hemorrhoids. ?- The examination was otherwise normal. ?- No specimens collected. ? ?Past Medical History:  ?Diagnosis Date  ? Adnexal mass   ? Diabetes mellitus without complication ([place])   ? GERD (gastroesophageal reflux disease)   ? Hyperlipidemia   ? Screening for colon cancer 04/2021  ? NEG Cologuard; repeat after 3 yrs  ? Urethral diverticulum   ? ? ?Past Surgical History:  ?Procedure Laterality Date  ? COLONOSCOPY WITH PROPOFOL N/A 10/11/2021  ? Procedure: COLONOSCOPY WITH PROPOFOL;  Surgeon: Lin Landsman, MD;  Location: [facility] ENDOSCOPY;  Service: Gastroenterology;  Laterality: N/A;  ? COLPOSCOPY  2001  ? normal PJR  ? ESOPHAGOGASTRODUODENOSCOPY  2000  ? [facility]  ? excision of urethral diverticulum  02/2008  ? myringtomy    ? as a child  ? TYMPANOPLASTY Left 2004  ? ?Current Outpatient Medications:  ?  CORLANOR 5 MG TABS tablet, Take 10 mg by mouth once., Disp: , Rfl:  ?  fexofenadine (ALLEGRA) 180 MG tablet, Take 180 mg by mouth daily., Disp: , Rfl:  ?  ibuprofen (ADVIL,MOTRIN) 800 MG tablet, , Disp: , Rfl: 5 ?  lisinopril (PRINIVIL,ZESTRIL) 2.5 MG tablet, , Disp: , Rfl: 10 ?  lovastatin (MEVACOR) 10 MG tablet, Take 1 tablet by mouth daily., Disp: , Rfl:  ?  norethindrone (MICRONOR) 0.35 MG tablet, Take 1 tablet (0.35 mg total) by mouth daily., Disp: 84 tablet, Rfl: 3 ?  Saxagliptin-Metformin 11-998 MG TB24, Take 1 tablet by mouth 2 (two) times daily., Disp: , Rfl:  ?  triamcinolone cream (KENALOG) 0.1 %, Apply topically., Disp: , Rfl:  ?  VITAMIN D-1000 MAX ST 25 MCG (1000 UT) tablet, Take 1,000 Units by mouth daily., Disp: , Rfl:  ? ? ?Family History  ?Problem Relation Age of Onset  ? Coronary artery disease Mother   ? Diabetes Mother   ?     Type 2  ? Hypertension Mother   ? Hypertension Father   ? Diabetes Brother   ?     Type 2  ? Hypertension Maternal Grandmother   ? Renal Disease Maternal Grandmother   ? Leukemia Paternal Grandmother 26  ? Hypertension Paternal Grandfather   ?  Alzheimer's disease Paternal Grandfather   ? Hyperthyroidism Paternal Grandfather   ? Breast cancer Neg Hx   ?  ? ?Social History  ? ?Tobacco Use  ? Smoking status: Never  ? Smokeless tobacco: Never  ?Vaping Use  ? Vaping Use: Never used  ?Substance Use Topics  ? Alcohol use: Yes  ?  Comment: rare  ? Drug use: No  ? ? ?Allergies as of 11/20/2021 - Review Complete 11/20/2021  ?Allergen Reaction Noted  ? Bactrim [sulfamethoxazole-trimethoprim] Rash 12/05/2015  ? ? ?Review of Systems:    ?All systems reviewed and negative except where noted in HPI. ? ? Physical Exam:  ?BP 112/75 (BP Location: Left Arm, Patient Position: Sitting, Cuff Size: Normal)   Pulse 81   Temp 98.9 ?F (37.2 ?C) (Oral)   Ht 5' 1"  (1.549 m)   Wt 110 lb 8 oz (50.1 kg)   BMI 20.88 kg/m?  ?No LMP recorded. (Menstrual status: Oral contraceptives). ? ?General:   Alert,  Well-developed, well-nourished, pleasant and cooperative in NAD ?Head:  Normocephalic and atraumatic. ?Eyes:  Sclera clear, no icterus.   Conjunctiva pink. ?Ears:  Normal auditory acuity. ?Nose:  No deformity, discharge, or lesions. ?Mouth:  No deformity or lesions,oropharynx pink & moist. ?Neck:  Supple; no masses or thyromegaly. ?Lungs:  Respirations even and unlabored.  Clear throughout to auscultation.   No wheezes, crackles, or rhonchi. No acute distress. ?Heart:  Regular rate and rhythm; no murmurs, clicks, rubs, or gallops. ?Abdomen:  Normal bowel sounds. Soft, non-tender and mildly distended, tympanic to percussion without masses, hepatosplenomegaly or hernias noted.  No guarding or rebound tenderness.   ?Rectal: Nontender digital rectal exam, normal perianal exam ?Msk:  Symmetrical without gross deformities. Good, equal movement & strength bilaterally. ?Pulses:  Normal pulses noted. ?Extremities:  No clubbing or edema.  No cyanosis. ?Neurologic:  Alert and oriented x3;  grossly normal neurologically. ?Skin:  Intact without significant lesions or rashes. No jaundice. ?Psych:   Alert and cooperative. Normal mood and affect. ? ?Imaging Studies: ?Reviewed ? ?Assessment and Plan:  ? ?Dominique Valdez is a 46 y.o. Hispanic female with prior history of constipation, now with loose stools, abdominal bloating, upper abdominal discomfort, family history of colon cancer in first cousin at age 27, H. pylori breath test negative, colonoscopy was unremarkable.  Patient has grade 1 symptomatic hemorrhoids ? ?Grade 1 symptomatic hemorrhoids ?Discussed about outpatient hemorrhoid ligation including risks and benefits, consent obtained ?Proceed with hemorrhoid ligation today ? ?Follow up in 2 weeks ? ? ?Cephas Darby, MD ? ?

## 2021-11-21 NOTE — Progress Notes (Signed)
PROCEDURE NOTE: ?The patient presents with symptomatic grade 1 hemorrhoids, unresponsive to maximal medical therapy, requesting rubber band ligation of his/her hemorrhoidal disease.  All risks, benefits and alternative forms of therapy were described and informed consent was obtained. ? ?The decision was made to band the RP internal hemorrhoid, and the [place] O?Regan System was used to perform band ligation without complication.  Digital anorectal examination was then performed to assure proper positioning of the band, and to adjust the banded tissue as required.  The patient was discharged home without pain or other issues.  Dietary and behavioral recommendations were given and (if necessary - prescriptions were given), along with follow-up instructions.  The patient will return 2 weeks for follow-up and possible additional banding as required. ? ?No complications were encountered and the patient tolerated the procedure well. ? ? ?

## 2021-12-04 ENCOUNTER — Encounter: Payer: Self-pay | Admitting: Gastroenterology

## 2021-12-04 ENCOUNTER — Ambulatory Visit (INDEPENDENT_AMBULATORY_CARE_PROVIDER_SITE_OTHER): Payer: Managed Care, Other (non HMO) | Admitting: Gastroenterology

## 2021-12-04 VITALS — BP 118/76 | HR 99 | Temp 98.4°F | Ht 61.0 in | Wt 113.0 lb

## 2021-12-04 DIAGNOSIS — K640 First degree hemorrhoids: Secondary | ICD-10-CM | POA: Diagnosis not present

## 2021-12-04 NOTE — Progress Notes (Signed)
PROCEDURE NOTE: The patient presents with symptomatic grade 1 hemorrhoids, unresponsive to maximal medical therapy, requesting rubber band ligation of his/her hemorrhoidal disease.  All risks, benefits and alternative forms of therapy were described and informed consent was obtained.  Digital rectal exam today revealed retained band on the right posterior hemorrhoid.  This has been dislodged manually.  The decision was made to band the RA internal hemorrhoid, and the [place] was used to perform band ligation without complication.  Digital anorectal examination was then performed to assure proper positioning of the band, and to adjust the banded tissue as required.  The patient was discharged home without pain or other issues.  Dietary and behavioral recommendations were given and (if necessary - prescriptions were given), along with follow-up instructions.  The patient will return 2 weeks for follow-up and possible additional banding as required.  No complications were encountered and the patient tolerated the procedure well.

## 2021-12-11 ENCOUNTER — Ambulatory Visit: Payer: Managed Care, Other (non HMO) | Admitting: Gastroenterology

## 2021-12-18 ENCOUNTER — Encounter: Payer: Self-pay | Admitting: Gastroenterology

## 2021-12-18 ENCOUNTER — Ambulatory Visit (INDEPENDENT_AMBULATORY_CARE_PROVIDER_SITE_OTHER): Payer: Managed Care, Other (non HMO) | Admitting: Gastroenterology

## 2021-12-18 VITALS — BP 115/77 | HR 86 | Temp 98.3°F | Ht 61.0 in | Wt 112.1 lb

## 2021-12-18 DIAGNOSIS — K644 Residual hemorrhoidal skin tags: Secondary | ICD-10-CM | POA: Diagnosis not present

## 2021-12-18 DIAGNOSIS — K640 First degree hemorrhoids: Secondary | ICD-10-CM

## 2021-12-18 NOTE — Progress Notes (Signed)
PROCEDURE NOTE: The patient presents with symptomatic grade 1 hemorrhoids, unresponsive to maximal medical therapy, requesting rubber band ligation of his/her hemorrhoidal disease.  All risks, benefits and alternative forms of therapy were described and informed consent was obtained.  Patient reports ongoing improvement in her hemorrhoidal symptoms.  She reports that she has been feeling more than 50% better, she has less frequent episodes of hemorrhoidal symptoms compared to before banding  The decision was made to band the LL internal hemorrhoid, and the [place] was used to perform band ligation without complication.  Digital anorectal examination was then performed to assure proper positioning of the band, and to adjust the banded tissue as required.  The patient was discharged home without pain or other issues.  Dietary and behavioral recommendations were given and (if necessary - prescriptions were given), along with follow-up instructions.  The patient will return 2 weeks for follow-up and possible additional banding as required.  No complications were encountered and the patient tolerated the procedure well.

## 2022-02-07 ENCOUNTER — Telehealth: Payer: Self-pay

## 2022-02-07 DIAGNOSIS — Z3041 Encounter for surveillance of contraceptive pills: Secondary | ICD-10-CM

## 2022-02-07 MED ORDER — NORETHINDRONE 0.35 MG PO TABS: 1.0000 | ORAL_TABLET | Freq: Every day | ORAL | 0 refills | Status: DC

## 2022-02-07 NOTE — Telephone Encounter (Signed)
Pt called for bc refill . Refill sent in and her annual is scheduled for September.

## 2022-03-27 ENCOUNTER — Encounter: Payer: Self-pay | Admitting: Obstetrics and Gynecology

## 2022-03-27 ENCOUNTER — Ambulatory Visit (INDEPENDENT_AMBULATORY_CARE_PROVIDER_SITE_OTHER): Payer: Managed Care, Other (non HMO) | Admitting: Obstetrics and Gynecology

## 2022-03-27 VITALS — BP 118/70 | Ht 61.0 in | Wt 109.0 lb

## 2022-03-27 DIAGNOSIS — Z1231 Encounter for screening mammogram for malignant neoplasm of breast: Secondary | ICD-10-CM | POA: Diagnosis not present

## 2022-03-27 DIAGNOSIS — N644 Mastodynia: Secondary | ICD-10-CM

## 2022-03-27 DIAGNOSIS — Z01419 Encounter for gynecological examination (general) (routine) without abnormal findings: Secondary | ICD-10-CM | POA: Diagnosis not present

## 2022-03-27 DIAGNOSIS — B3731 Acute candidiasis of vulva and vagina: Secondary | ICD-10-CM | POA: Diagnosis not present

## 2022-03-27 DIAGNOSIS — Z3041 Encounter for surveillance of contraceptive pills: Secondary | ICD-10-CM | POA: Diagnosis not present

## 2022-03-27 MED ORDER — FLUCONAZOLE 150 MG PO TABS: 150.0000 mg | ORAL_TABLET | Freq: Once | ORAL | 3 refills | Status: DC

## 2022-03-27 MED ORDER — NORETHINDRONE 0.35 MG PO TABS: 1.0000 | ORAL_TABLET | Freq: Every day | ORAL | 3 refills | Status: DC

## 2022-03-27 NOTE — Progress Notes (Signed)
PCP: Leanna Sato, MD   Chief Complaint  Patient presents with   Gynecologic Exam    HPI:      [name] is a 46 y.o. [phone] whose LMP was No LMP recorded. (Menstrual status: Oral contraceptives)., presents today for her annual examination.  Her menses are absent on POPs , no BTB/dysmen. Wants to cont POPs.  Sex activity: single partner, contraception - oral progesterone-only contraceptive. No pain/bleeding  Last Pap: 02/23/20  Results were: no abnormalities /neg HPV DNA.   Last mammogram: 05/10/21 Results were: normal--routine follow-up in 12 months There is no FH of breast cancer. There is no FH of ovarian cancer. The patient does occas do self-breast exams. Hx of LT breast cyst in past; neg LT breast u/s 7/19 for pain/"lumps". Otilio Carpen gets LT breast pain without mass intermittently, lasting about a wk. Pt doesn't have periods with POPs so unsure if cycle related, drinks caffeine daily.   Colonoscopy: 3/23 with [facility] due to rectal pain/pressure. Diagnosed with internal hemorrhoids and doing banding. Repeat due after 10 yrs. Neg cologuard 9/22 but then started having sx.   Tobacco use: The patient denies current or previous tobacco use. Alcohol use: none No drug use Exercise: occas exercise  She does not get adequate calcium but is taking Vitamin D in her diet.  Labs with PCP.   She occas gets yeast vag sx and would like diflucan Rx. Sx improve with a better diet, not taking probiotics.   Past Medical History:  Diagnosis Date   Adnexal mass    Diabetes mellitus without complication ([facility])    GERD (gastroesophageal reflux disease)    Hyperlipidemia    Screening for colon cancer 04/2021   NEG Cologuard; repeat after 3 yrs   Urethral diverticulum     Past Surgical History:  Procedure Laterality Date   COLONOSCOPY WITH PROPOFOL N/A 10/11/2021   Procedure: COLONOSCOPY WITH PROPOFOL;  Surgeon: Toney Reil, MD;  Location: [facility] ENDOSCOPY;  Service:  Gastroenterology;  Laterality: N/A;   COLPOSCOPY  2001   normal PJR   ESOPHAGOGASTRODUODENOSCOPY  2000   [place]   excision of urethral diverticulum  02/2008   myringtomy     as a child   TYMPANOPLASTY Left 2004    Family History  Problem Relation Age of Onset   Coronary artery disease Mother    Diabetes Mother        Type 2   Hypertension Mother    Hypertension Father    Diabetes Brother        Type 2   Hypertension Maternal Grandmother    Renal Disease Maternal Grandmother    Leukemia Paternal Grandmother 40   Hypertension Paternal Grandfather    Alzheimer's disease Paternal Grandfather    Hyperthyroidism Paternal Grandfather    Breast cancer Neg Hx     Social History   Socioeconomic History   Marital status: Married    Spouse name: Not on file   Number of children: 2   Years of education: Not on file   Highest education level: Not on file  Occupational History   Occupation: Patient Care Coordinator  Tobacco Use   Smoking status: Never   Smokeless tobacco: Never  Vaping Use   Vaping Use: Never used  Substance and Sexual Activity   Alcohol use: Yes    Comment: rare   Drug use: No   Sexual activity: Yes    Birth control/protection: Pill    Comment: minipill  Other  Topics Concern   Not on file  Social History Narrative   Not on file   Social Determinants of Health   Financial Resource Strain: Not on file  Food Insecurity: Not on file  Transportation Needs: Not on file  Physical Activity: Inactive (01/13/2018)   Exercise Vital Sign    Days of Exercise per Week: 0 days    Minutes of Exercise per Session: 0 min  Stress: No Stress Concern Present (01/13/2018)   [employer] of Occupational Health - Occupational Stress Questionnaire    Feeling of Stress : Not at all  Social Connections: Not on file  Intimate Partner Violence: Not on file     Current Outpatient Medications:    fexofenadine (ALLEGRA) 180 MG tablet, Take 180 mg by mouth daily.,  Disp: , Rfl:    fluconazole (DIFLUCAN) 150 MG tablet, Take 1 tablet (150 mg total) by mouth once for 1 dose., Disp: 1 tablet, Rfl: 3   ibuprofen (ADVIL,MOTRIN) 800 MG tablet, , Disp: , Rfl: 5   lisinopril (PRINIVIL,ZESTRIL) 2.5 MG tablet, , Disp: , Rfl: 10   lovastatin (MEVACOR) 10 MG tablet, Take 1 tablet by mouth daily., Disp: , Rfl:    Saxagliptin-Metformin 11-998 MG TB24, Take 1 tablet by mouth 2 (two) times daily., Disp: , Rfl:    triamcinolone cream (KENALOG) 0.1 %, Apply topically., Disp: , Rfl:    VITAMIN D-1000 MAX ST 25 MCG (1000 UT) tablet, Take 1,000 Units by mouth daily., Disp: , Rfl:    norethindrone (MICRONOR) 0.35 MG tablet, Take 1 tablet (0.35 mg total) by mouth daily., Disp: 84 tablet, Rfl: 3     ROS:  Review of Systems  Constitutional:  Negative for fatigue, fever and unexpected weight change.  Respiratory:  Negative for cough, shortness of breath and wheezing.   Cardiovascular:  Negative for chest pain, palpitations and leg swelling.  Gastrointestinal:  Negative for blood in stool, constipation, diarrhea, nausea and vomiting.  Endocrine: Negative for cold intolerance, heat intolerance and polyuria.  Genitourinary:  Negative for dyspareunia, dysuria, flank pain, frequency, genital sores, hematuria, menstrual problem, pelvic pain, urgency, vaginal bleeding, vaginal discharge and vaginal pain.  Musculoskeletal:  Negative for back pain, joint swelling and myalgias.  Skin:  Negative for rash.  Neurological:  Negative for dizziness, syncope, light-headedness, numbness and headaches.  Hematological:  Negative for adenopathy.  Psychiatric/Behavioral:  Negative for agitation, confusion, sleep disturbance and suicidal ideas. The patient is not nervous/anxious.    BREAST: No symptoms    Objective: BP 118/70   Ht 5\' 1"  (1.549 m)   Wt 109 lb (49.4 kg)   BMI 20.60 kg/m    Physical Exam Constitutional:      Appearance: She is well-developed.  Genitourinary:     Vulva  normal.     Right Labia: No rash, tenderness or lesions.    Left Labia: No tenderness, lesions or rash.    No vaginal discharge, erythema or tenderness.      Right Adnexa: not tender and no mass present.    Left Adnexa: not tender and no mass present.    No cervical friability or polyp.     Uterus is not enlarged or tender.  Breasts:    Right: No mass, nipple discharge, skin change or tenderness.     Left: No mass, nipple discharge, skin change or tenderness.  Neck:     Thyroid: No thyromegaly.  Cardiovascular:     Rate and Rhythm: Normal rate and regular rhythm.  Heart sounds: Normal heart sounds. No murmur heard. Pulmonary:     Effort: Pulmonary effort is normal.     Breath sounds: Normal breath sounds.  Abdominal:     Palpations: Abdomen is soft.     Tenderness: There is no abdominal tenderness. There is no guarding or rebound.  Musculoskeletal:        General: Normal range of motion.     Cervical back: Normal range of motion.  Lymphadenopathy:     Cervical: No cervical adenopathy.  Neurological:     General: No focal deficit present.     Mental Status: She is alert and oriented to person, place, and time.     Cranial Nerves: No cranial nerve deficit.  Skin:    General: Skin is warm and dry.  Psychiatric:        Mood and Affect: Mood normal.        Behavior: Behavior normal.        Thought Content: Thought content normal.        Judgment: Judgment normal.  Vitals reviewed.     Assessment/Plan: Encounter for annual routine gynecological examination  Encounter for surveillance of contraceptive pills - Plan: norethindrone (MICRONOR) 0.35 MG tablet, Rx RF  Encounter for screening mammogram for malignant neoplasm of breast - Plan: MM 3D SCREEN BREAST BILATERAL; pt to schedule mammo  Yeast vaginitis - Plan: fluconazole (DIFLUCAN) 150 MG tablet; Rx diflucan prn yeast vag sx. Add probiotics.   Left breast pain--neg breast exam, sx intermittent. See if cyclical even  though not bleeding with POPs. D/C caffeine. If sx persist, can add breast u/s to mammo order.   Meds ordered this encounter  Medications   DISCONTD: norethindrone (MICRONOR) 0.35 MG tablet    Sig: Take 1 tablet (0.35 mg total) by mouth daily.    Dispense:  84 tablet    Refill:  3    Order Specific Question:   Supervising Provider    Answer:   Hildred Laser [[id number]]   fluconazole (DIFLUCAN) 150 MG tablet    Sig: Take 1 tablet (150 mg total) by mouth once for 1 dose.    Dispense:  1 tablet    Refill:  3    Order Specific Question:   Supervising Provider    Answer:   Hildred Laser [[id number]]   norethindrone (MICRONOR) 0.35 MG tablet    Sig: Take 1 tablet (0.35 mg total) by mouth daily.    Dispense:  84 tablet    Refill:  3    Order Specific Question:   Supervising Provider    Answer:   Waymon Budge           GYN counsel breast self exam, mammography screening, menopause, adequate intake of calcium and vitamin D, diet and exercise    F/U  Return in about 1 year (around 03/28/2023).  Trang Bouse B. Devarius Nelles, PA-C 03/27/2022 5:02 PM

## 2022-03-27 NOTE — Patient Instructions (Addendum)
I value your feedback and you entrusting us with your care. If you get a [facility] patient survey, I would appreciate you taking the time to let us know about your experience today. Thank you!  [facility]: [phone]      

## 2022-05-12 ENCOUNTER — Ambulatory Visit
Admission: RE | Admit: 2022-05-12 | Discharge: 2022-05-12 | Disposition: A | Discharge status: Home / Self Care | Payer: Managed Care, Other (non HMO) | Source: Ambulatory Visit | Attending: Obstetrics and Gynecology | Admitting: Obstetrics and Gynecology

## 2022-05-12 DIAGNOSIS — Z1231 Encounter for screening mammogram for malignant neoplasm of breast: Secondary | ICD-10-CM | POA: Diagnosis not present

## 2023-03-02 ENCOUNTER — Other Ambulatory Visit: Payer: Self-pay | Admitting: Obstetrics and Gynecology

## 2023-03-02 DIAGNOSIS — Z3041 Encounter for surveillance of contraceptive pills: Secondary | ICD-10-CM

## 2023-03-30 ENCOUNTER — Encounter: Payer: Self-pay | Admitting: Obstetrics and Gynecology

## 2023-04-01 ENCOUNTER — Other Ambulatory Visit: Payer: Self-pay | Admitting: Obstetrics and Gynecology

## 2023-04-01 DIAGNOSIS — Z3041 Encounter for surveillance of contraceptive pills: Secondary | ICD-10-CM

## 2023-04-02 ENCOUNTER — Other Ambulatory Visit (HOSPITAL_COMMUNITY)
Admission: RE | Admit: 2023-04-02 | Discharge: 2023-04-02 | Disposition: A | Discharge status: Home / Self Care | Payer: Managed Care, Other (non HMO) | Source: Ambulatory Visit | Attending: Obstetrics and Gynecology | Admitting: Obstetrics and Gynecology

## 2023-04-02 ENCOUNTER — Encounter: Payer: Self-pay | Admitting: Obstetrics and Gynecology

## 2023-04-02 ENCOUNTER — Ambulatory Visit (INDEPENDENT_AMBULATORY_CARE_PROVIDER_SITE_OTHER): Payer: Managed Care, Other (non HMO) | Admitting: Obstetrics and Gynecology

## 2023-04-02 VITALS — BP 109/76 | HR 80 | Ht 61.0 in | Wt 114.2 lb

## 2023-04-02 DIAGNOSIS — Z3041 Encounter for surveillance of contraceptive pills: Secondary | ICD-10-CM

## 2023-04-02 DIAGNOSIS — B3731 Acute candidiasis of vulva and vagina: Secondary | ICD-10-CM | POA: Insufficient documentation

## 2023-04-02 DIAGNOSIS — Z1151 Encounter for screening for human papillomavirus (HPV): Secondary | ICD-10-CM

## 2023-04-02 DIAGNOSIS — Z01419 Encounter for gynecological examination (general) (routine) without abnormal findings: Secondary | ICD-10-CM

## 2023-04-02 DIAGNOSIS — Z1231 Encounter for screening mammogram for malignant neoplasm of breast: Secondary | ICD-10-CM

## 2023-04-02 DIAGNOSIS — N644 Mastodynia: Secondary | ICD-10-CM

## 2023-04-02 DIAGNOSIS — Z124 Encounter for screening for malignant neoplasm of cervix: Secondary | ICD-10-CM | POA: Diagnosis present

## 2023-04-02 MED ORDER — FLUCONAZOLE 150 MG PO TABS: 150.0000 mg | ORAL_TABLET | Freq: Once | ORAL | 3 refills | Status: AC

## 2023-04-02 MED ORDER — NORETHINDRONE 0.35 MG PO TABS: 1.0000 | ORAL_TABLET | Freq: Every day | ORAL | 3 refills | Status: DC

## 2023-04-02 NOTE — Progress Notes (Signed)
PCP: Leanna Sato, MD   Chief Complaint  Patient presents with   Annual Exam    HPI:      [name] is a 47 y.o. [id number] whose LMP was Patient's last menstrual period was 03/26/2023 (exact date)., presents today for her annual examination.  Her menses are absent on POPs; occas BTB if a few min late taking it/mild dsymen with BTB.   Sex activity: single partner, contraception - oral progesterone-only contraceptive. No pain/bleeding  Last Pap: 02/23/20  Results were: no abnormalities /neg HPV DNA.   Last mammogram: 05/12/22 Results were: normal--routine follow-up in 12 months There is no FH of breast cancer. There is no FH of ovarian cancer. The patient does occas do self-breast exams. Hx of LT breast cyst in past; neg LT breast u/s 7/19 for pain/"lumps". Still having intermittent LT breast pain UOQ, without discrete mass but does feel some "swelling" there occas.  Pt doesn't have periods with POPs so unsure if cycle related, drinks caffeine daily. Pt wants dx mammo.   Colonoscopy: 3/23 with [facility] due to rectal pain/pressure. Diagnosed with internal hemorrhoids and doing banding. Repeat due after 10 yrs. Neg cologuard 9/22 but then started having sx.   Tobacco use: The patient denies current or previous tobacco use. Alcohol use: none No drug use Exercise: occas exercise  She does get adequate calcium and Vitamin D in her diet.  Labs with PCP.   She occas gets yeast vag sx and would like diflucan Rx.   Past Medical History:  Diagnosis Date   Adnexal mass    Diabetes mellitus without complication (HCC)    GERD (gastroesophageal reflux disease)    Hyperlipidemia    Screening for colon cancer 04/2021   NEG Cologuard; repeat after 3 yrs   Urethral diverticulum     Past Surgical History:  Procedure Laterality Date   COLONOSCOPY WITH PROPOFOL N/A 10/11/2021   Procedure: COLONOSCOPY WITH PROPOFOL;  Surgeon: Toney Reil, MD;  Location: [facility] ENDOSCOPY;   Service: Gastroenterology;  Laterality: N/A;   COLPOSCOPY  2001   normal PJR   ESOPHAGOGASTRODUODENOSCOPY  2000   [place]   excision of urethral diverticulum  02/2008   myringtomy     as a child   TYMPANOPLASTY Left 2004    Family History  Problem Relation Age of Onset   Coronary artery disease Mother    Diabetes Mother        Type 2   Hypertension Mother    Hypertension Father    Diabetes Brother        Type 2   Hypertension Maternal Grandmother    Renal Disease Maternal Grandmother    Leukemia Paternal Grandmother 77   Hypertension Paternal Grandfather    Alzheimer's disease Paternal Grandfather    Hyperthyroidism Paternal Grandfather    Breast cancer Neg Hx     Social History   Socioeconomic History   Marital status: Married    Spouse name: Not on file   Number of children: 2   Years of education: Not on file   Highest education level: Not on file  Occupational History   Occupation: Patient Care Coordinator  Tobacco Use   Smoking status: Never   Smokeless tobacco: Never  Vaping Use   Vaping status: Never Used  Substance and Sexual Activity   Alcohol use: Yes    Comment: rare   Drug use: No   Sexual activity: Yes    Birth control/protection: Pill  Comment: minipill  Other Topics Concern   Not on file  Social History Narrative   Not on file   Social Determinants of Health   Financial Resource Strain: Not on file  Food Insecurity: Not on file  Transportation Needs: Not on file  Physical Activity: Inactive (01/13/2018)   Exercise Vital Sign    Days of Exercise per Week: 0 days    Minutes of Exercise per Session: 0 min  Stress: No Stress Concern Present (01/13/2018)   [employer] of Occupational Health - Occupational Stress Questionnaire    Feeling of Stress : Not at all  Social Connections: Not on file  Intimate Partner Violence: Not on file     Current Outpatient Medications:    fexofenadine (ALLEGRA) 180 MG tablet, Take 180 mg by mouth  daily., Disp: , Rfl:    ibuprofen (ADVIL,MOTRIN) 800 MG tablet, , Disp: , Rfl: 5   lovastatin (MEVACOR) 10 MG tablet, Take 1 tablet by mouth daily., Disp: , Rfl:    Saxagliptin-Metformin 11-998 MG TB24, Take 1 tablet by mouth 2 (two) times daily., Disp: , Rfl:    triamcinolone cream (KENALOG) 0.1 %, Apply topically., Disp: , Rfl:    VITAMIN D-1000 MAX ST 25 MCG (1000 UT) tablet, Take 1,000 Units by mouth daily., Disp: , Rfl:    fluconazole (DIFLUCAN) 150 MG tablet, Take 1 tablet (150 mg total) by mouth once for 1 dose., Disp: 1 tablet, Rfl: 3   norethindrone (MICRONOR) 0.35 MG tablet, Take 1 tablet (0.35 mg total) by mouth daily., Disp: 84 tablet, Rfl: 3     ROS:  Review of Systems  Constitutional:  Negative for fatigue, fever and unexpected weight change.  Respiratory:  Negative for cough, shortness of breath and wheezing.   Cardiovascular:  Negative for chest pain, palpitations and leg swelling.  Gastrointestinal:  Negative for blood in stool, constipation, diarrhea, nausea and vomiting.  Endocrine: Negative for cold intolerance, heat intolerance and polyuria.  Genitourinary:  Negative for dyspareunia, dysuria, flank pain, frequency, genital sores, hematuria, menstrual problem, pelvic pain, urgency, vaginal bleeding, vaginal discharge and vaginal pain.  Musculoskeletal:  Negative for back pain, joint swelling and myalgias.  Skin:  Negative for rash.  Neurological:  Negative for dizziness, syncope, light-headedness, numbness and headaches.  Hematological:  Negative for adenopathy.  Psychiatric/Behavioral:  Negative for agitation, confusion, sleep disturbance and suicidal ideas. The patient is not nervous/anxious.    BREAST: breast tenderness    Objective: BP 109/76   Pulse 80   Ht 5\' 1"  (1.549 m)   Wt 114 lb 3.2 oz (51.8 kg)   LMP 03/26/2023 (Exact Date)   BMI 21.58 kg/m    Physical Exam Constitutional:      Appearance: She is well-developed.  Genitourinary:     Vulva  normal.     Right Labia: No rash, tenderness or lesions.    Left Labia: No tenderness, lesions or rash.    No vaginal discharge, erythema or tenderness.      Right Adnexa: not tender and no mass present.    Left Adnexa: not tender and no mass present.    No cervical friability or polyp.     Uterus is not enlarged or tender.  Breasts:    Right: No mass, nipple discharge, skin change or tenderness.     Left: No mass, nipple discharge, skin change or tenderness.  Neck:     Thyroid: No thyromegaly.  Cardiovascular:     Rate and Rhythm: Normal rate and regular  rhythm.     Heart sounds: Normal heart sounds. No murmur heard. Pulmonary:     Effort: Pulmonary effort is normal.     Breath sounds: Normal breath sounds.  Chest:    Abdominal:     Palpations: Abdomen is soft.     Tenderness: There is no abdominal tenderness. There is no guarding or rebound.  Musculoskeletal:        General: Normal range of motion.     Cervical back: Normal range of motion.  Lymphadenopathy:     Cervical: No cervical adenopathy.  Neurological:     General: No focal deficit present.     Mental Status: She is alert and oriented to person, place, and time.     Cranial Nerves: No cranial nerve deficit.  Skin:    General: Skin is warm and dry.  Psychiatric:        Mood and Affect: Mood normal.        Behavior: Behavior normal.        Thought Content: Thought content normal.        Judgment: Judgment normal.  Vitals reviewed.     Assessment/Plan: Encounter for annual routine gynecological examination  Cervical cancer screening - Plan: Cytology - PAP  Screening for HPV (human papillomavirus) - Plan: Cytology - PAP  Encounter for surveillance of contraceptive pills - Plan: norethindrone (MICRONOR) 0.35 MG tablet; Rx RF eRxd  Encounter for screening mammogram for malignant neoplasm of breast - Plan: [place] LIMITED ULTRASOUND INCLUDING AXILLA RIGHT BREAST, [place] LIMITED ULTRASOUND INCLUDING AXILLA LEFT BREAST  , MM 3D DIAGNOSTIC MAMMOGRAM BILATERAL BREAST;  pt to schedule dx mammo and u/s. Will f/u with results  Breast pain, left - Plan: [place] LIMITED ULTRASOUND INCLUDING AXILLA RIGHT BREAST, [place] LIMITED ULTRASOUND INCLUDING AXILLA LEFT BREAST , MM 3D DIAGNOSTIC MAMMOGRAM BILATERAL BREAST; d/c caffeine, add Vit E 400 BID if imaging neg.   Yeast vaginitis - Plan: fluconazole (DIFLUCAN) 150 MG tablet; Rx RF prn sx.    Meds ordered this encounter  Medications   norethindrone (MICRONOR) 0.35 MG tablet    Sig: Take 1 tablet (0.35 mg total) by mouth daily.    Dispense:  84 tablet    Refill:  3    Order Specific Question:   Supervising Provider    Answer:   Hildred Laser [[id number]]   fluconazole (DIFLUCAN) 150 MG tablet    Sig: Take 1 tablet (150 mg total) by mouth once for 1 dose.    Dispense:  1 tablet    Refill:  3    Order Specific Question:   Supervising Provider    Answer:   Waymon Budge           GYN counsel breast self exam, mammography screening, menopause, adequate intake of calcium and vitamin D, diet and exercise    F/U  Return in about 1 year (around 04/01/2024).  Elmer Boutelle B. Shenea Giacobbe, PA-C 04/02/2023 4:31 PM

## 2023-04-02 NOTE — Patient Instructions (Addendum)
I value your feedback and you entrusting [place] with your care. If you get a [facility] patient survey, I would appreciate you taking the time to let us know about your experience today. Thank you!  [facility] ([place])--[phone]

## 2023-04-04 IMAGING — CT CT HEART MORP W/ CTA COR W/ SCORE W/ CA W/CM &/OR W/O CM
2 of 13 series · 5 of 20 positions shown, 6 images · non-contrast
Comparison: None.

Addendum:
CLINICAL DATA: Chest pain

EXAM:
Cardiac/Coronary  CTA
TECHNIQUE: The patient was scanned on a Siemens Somatom go.Top scanner.

[Series 27: multiphase % cta coronary 0.60 · axial · 0.28mm/px · z∈[-1081,-1017]mm · 3 of 3542 slices shown, 4 images]
[im 886/3542  vessel]
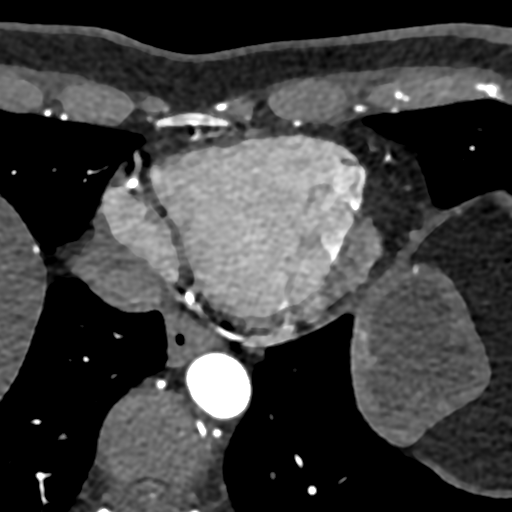
[im 886/3542  lung]
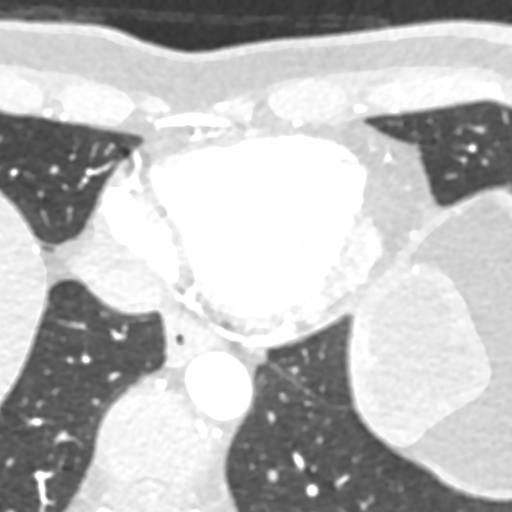
[im 1771/3542  vessel]
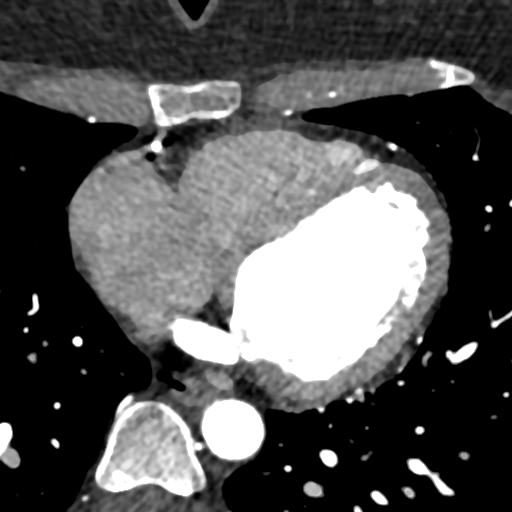
[im 2656/3542  vessel]
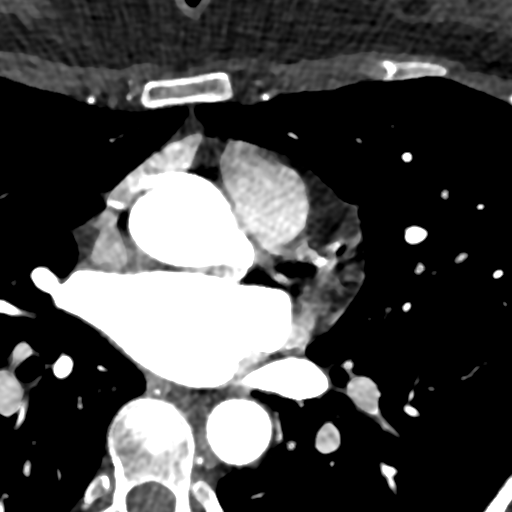

[Series 35: ms multiphase cta coronary 0.60 · axial · 0.28mm/px · z∈[-1070,-1027]mm · 2 of 2898 slices shown]
[im 966/2898  vessel]
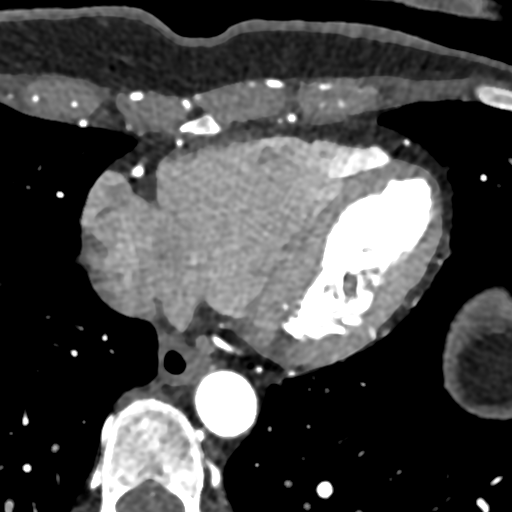
[im 1932/2898  vessel]
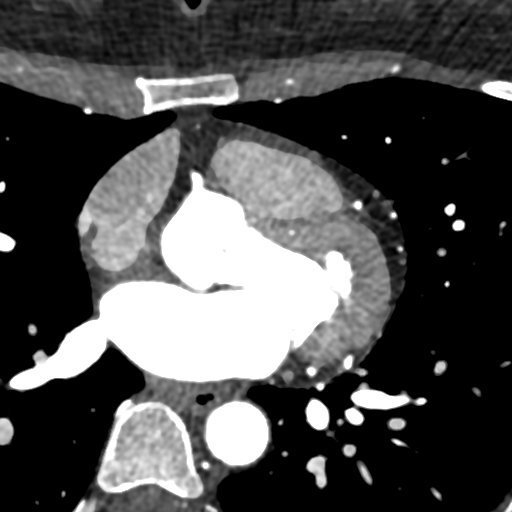

[5 of 20 positions shown; findings below may reference images not displayed]

:
A retrospective scan was triggered in the descending thoracic aorta.
Axial non-contrast 3 mm slices were carried out through the heart.
The data set was analyzed on a dedicated work station and scored
using the Agatson method. Gantry rotation speed was 330 msecs and
collimation was .6 mm. 100mg of metoprolol, 10mg ivabradine and
mg of sl NTG was given. The 3D data set was reconstructed in 5%
intervals of the 60-95 % of the R-R cycle. Diastolic phases were
analyzed on a dedicated work station using MPR, MIP and VRT modes.
The patient received 75 cc of contrast.
FINDINGS: Aorta:  Normal size.  No calcifications.  No dissection.

Aortic Valve:  Trileaflet.  No calcifications.

Coronary Arteries:  Normal coronary origin.  Right dominance.

RCA is a dominant artery that gives rise to PDA and PLA. There is no
plaque.

Left main is a large artery that gives rise to LAD and LCX arteries.
There is no LM disease.

LAD has no plaque.

LCX is a non-dominant artery that gives rise to two obtuse marginal
branches. There is no plaque.

Other findings:

Normal pulmonary vein drainage into the left atrium.

Normal left atrial appendage without a thrombus.

Normal size of the pulmonary artery.
IMPRESSION: 1. Normal coronary calcium score of 0. Patient is low risk for
coronary events.

2. Normal coronary origin with right dominance.

3. No evidence of CAD.

4. CAD-RADS 0. Consider non-atherosclerotic causes of chest pain.

EXAM:
OVER-READ INTERPRETATION  CT CHEST

The following report is an over-read performed by radiologist Dr.
Rosalee Jim [[email or web address]] on 08/19/2021. This over-read
does not include interpretation of cardiac or coronary anatomy or
pathology. The coronary CTA interpretation by the cardiologist is
attached.
FINDINGS: Vascular: Normal aortic caliber. No central pulmonary embolism, on
this non-dedicated study.

Mediastinum/Nodes: No imaged thoracic adenopathy.

Lungs/Pleura: No pleural fluid.  Clear imaged lungs.

Upper Abdomen: Normal imaged portions of the liver, spleen, stomach.

Musculoskeletal: No acute osseous abnormality.
IMPRESSION: No acute findings in the imaged extracardiac chest.

*** End of Addendum ***
:
A retrospective scan was triggered in the descending thoracic aorta.
Axial non-contrast 3 mm slices were carried out through the heart.
The data set was analyzed on a dedicated work station and scored
using the Agatson method. Gantry rotation speed was 330 msecs and
collimation was .6 mm. 100mg of metoprolol, 10mg ivabradine and
mg of sl NTG was given. The 3D data set was reconstructed in 5%
intervals of the 60-95 % of the R-R cycle. Diastolic phases were
analyzed on a dedicated work station using MPR, MIP and VRT modes.
The patient received 75 cc of contrast.
FINDINGS: Aorta:  Normal size.  No calcifications.  No dissection.

Aortic Valve:  Trileaflet.  No calcifications.

Coronary Arteries:  Normal coronary origin.  Right dominance.

RCA is a dominant artery that gives rise to PDA and PLA. There is no
plaque.

Left main is a large artery that gives rise to LAD and LCX arteries.
There is no LM disease.

LAD has no plaque.

LCX is a non-dominant artery that gives rise to two obtuse marginal
branches. There is no plaque.

Other findings:

Normal pulmonary vein drainage into the left atrium.

Normal left atrial appendage without a thrombus.

Normal size of the pulmonary artery.
IMPRESSION: 1. Normal coronary calcium score of 0. Patient is low risk for
coronary events.

2. Normal coronary origin with right dominance.

3. No evidence of CAD.

4. CAD-RADS 0. Consider non-atherosclerotic causes of chest pain.

## 2023-04-07 LAB — CYTOLOGY - PAP
Adequacy: ABSENT
Comment: NEGATIVE
Diagnosis: NEGATIVE
High risk HPV: NEGATIVE

## 2023-05-14 ENCOUNTER — Ambulatory Visit
Admission: RE | Admit: 2023-05-14 | Discharge: 2023-05-14 | Disposition: A | Discharge status: Home / Self Care | Payer: Managed Care, Other (non HMO) | Source: Ambulatory Visit | Attending: Obstetrics and Gynecology | Admitting: Obstetrics and Gynecology

## 2023-05-14 DIAGNOSIS — N644 Mastodynia: Secondary | ICD-10-CM

## 2023-05-14 DIAGNOSIS — Z1231 Encounter for screening mammogram for malignant neoplasm of breast: Secondary | ICD-10-CM | POA: Diagnosis present

## 2023-05-17 ENCOUNTER — Encounter: Payer: Self-pay | Admitting: Obstetrics and Gynecology

## 2023-09-11 ENCOUNTER — Encounter: Payer: Self-pay | Admitting: Emergency Medicine

## 2023-09-11 ENCOUNTER — Ambulatory Visit
Admission: EM | Admit: 2023-09-11 | Discharge: 2023-09-11 | Disposition: A | Discharge status: Home / Self Care | Payer: Managed Care, Other (non HMO) | Attending: Emergency Medicine | Admitting: Emergency Medicine

## 2023-09-11 DIAGNOSIS — J3489 Other specified disorders of nose and nasal sinuses: Secondary | ICD-10-CM

## 2023-09-11 DIAGNOSIS — K120 Recurrent oral aphthae: Secondary | ICD-10-CM | POA: Diagnosis not present

## 2023-09-11 MED ORDER — MUPIROCIN 2 % EX OINT: 1.0000 | TOPICAL_OINTMENT | Freq: Two times a day (BID) | CUTANEOUS | 0 refills | Status: AC

## 2023-09-11 MED ORDER — NYSTATIN 100000 UNIT/ML MT SUSP: 5.0000 mL | Freq: Three times a day (TID) | OROMUCOSAL | 0 refills | Status: AC | PRN

## 2023-09-11 NOTE — ED Provider Notes (Signed)
 [facility]    CSN: [id number] Arrival date & time: 09/11/23  1550      History   Chief Complaint Chief Complaint  Patient presents with   Dental Problem   Blister    HPI Dominique Valdez is a 48 y.o. female.   48 year old female, Dominique Valdez, presents to urgent care for evaluation of ulcers/sores in mouth that started 2 days ago, patient also complaining of bump to left side of nose that is recurrent"looks like a pimple".  Patient states she has been treated for MRSA in the past and is wondering if that is what is causing her symptoms.   The history is provided by the patient. No language interpreter was used.    Past Medical History:  Diagnosis Date   Adnexal mass    Diabetes mellitus without complication (HCC)    GERD (gastroesophageal reflux disease)    Hyperlipidemia    Screening for colon cancer 04/2021   NEG Cologuard; repeat after 3 yrs   Urethral diverticulum     Patient Active Problem List   Diagnosis Date Noted   Aphthous ulcer 09/11/2023   Nasal sore 09/11/2023   Yeast vaginitis 04/02/2023   Change in bowel habit    External hemorrhoids    Contraceptive management 01/03/2017   Diabetes mellitus without complication (HCC)    GERD (gastroesophageal reflux disease)    Hyperlipidemia     Past Surgical History:  Procedure Laterality Date   COLONOSCOPY WITH PROPOFOL N/A 10/11/2021   Procedure: COLONOSCOPY WITH PROPOFOL;  Surgeon: Toney Reil, MD;  Location: [facility] END[facility]COPY;  Service: Gastroenterology;  Laterality: N/A;   COLPOSCOPY  2001   normal PJR   ESOPHAGOGASTRODUODENOSCOPY  2000   [place]   excision of urethral diverticulum  02/2008   myringtomy     as a child   TYMPANOPLASTY Left 2004    OB History     Gravida  2   Para  2   Term  2   Preterm      AB      Living  2      SAB      IAB      Ectopic      Multiple      Live Births  2        Obstetric Comments  GDM with both pregnancies. Was on  insulin with 2008 pregnancy          Home Medications    Prior to Admission medications   Medication Sig Start Date End Date Taking? Authorizing Provider  magic mouthwash (nystatin, lidocaine, diphenhydrAMINE, alum & mag hydroxide) suspension Swish and spit 5 mLs 3 (three) times daily as needed for mouth pain. 09/11/23  Yes Liylah Najarro, Para March, NP  mupirocin ointment (BACTROBAN) 2 % Apply 1 Application topically 2 (two) times daily for 14 days. Bilateral nares 09/11/23 09/25/23 Yes Azim Gillingham, Para March, NP  fexofenadine (ALLEGRA) 180 MG tablet Take 180 mg by mouth daily.   Yes [provider]  ibuprofen (ADVIL,MOTRIN) 800 MG tablet  12/11/17   [provider]  lovastatin (MEVACOR) 10 MG tablet Take 1 tablet by mouth daily. 01/13/20  Yes [provider]  norethindrone (MICRONOR) 0.35 MG tablet Take 1 tablet (0.35 mg total) by mouth daily. 04/02/23  Yes Copland, Ilona Sorrel, PA-C  Saxagliptin-Metformin 11-998 MG TB24 Take 1 tablet by mouth 2 (two) times daily.   Yes [provider]  triamcinolone cream (KENALOG) 0.1 % Apply topically. 08/28/21   [provider]  VITAMIN D-1000 MAX ST 25 MCG (1000 UT) tablet Take 1,000 Units by mouth daily. 07/01/21  Yes [provider]    Family History Family History  Problem Relation Age of Onset   Coronary artery disease Mother    Diabetes Mother        Type 2   Hypertension Mother    Hypertension Father    Diabetes Brother        Type 2   Hypertension Maternal Grandmother    Renal Disease Maternal Grandmother    Leukemia Paternal Grandmother 3   Hypertension Paternal Grandfather    Alzheimer's disease Paternal Grandfather    Hyperthyroidism Paternal Grandfather    Breast cancer Neg Hx     Social History Social History   Tobacco Use   Smoking status: Never   Smokeless tobacco: Never  Vaping Use   Vaping status: Never Used  Substance Use Topics   Alcohol use: Yes    Comment: rare   Drug use:  No     Allergies   Bactrim [sulfamethoxazole-trimethoprim]   Review of Systems Review of Systems  Constitutional:  Negative for fever.  HENT:  Positive for mouth sores.   Skin:        Left nose wound  All other systems reviewed and are negative.    Physical Exam Triage Vital Signs ED Triage Vitals  Encounter Vitals Group     BP      Systolic BP Percentile      Diastolic BP Percentile      Pulse      Resp      Temp      Temp src      SpO2      Weight      Height      Head Circumference      Peak Flow      Pain Score      Pain Loc      Pain Education      Exclude from Growth Chart    No data found.  Updated Vital Signs BP (!) 140/87 (BP Location: Right Arm)   Pulse 85   Temp 98.5 F (36.9 C) (Oral)   Resp 14   Ht 5\' 1"  (1.549 m)   Wt 114 lb (51.7 kg)   SpO2 98%   BMI 21.54 kg/m   Visual Acuity Right Eye Distance:   Left Eye Distance:   Bilateral Distance:    Right Eye Near:   Left Eye Near:    Bilateral Near:     Physical Exam Vitals and nursing note reviewed.  Constitutional:      Appearance: She is well-developed and well-groomed.  HENT:     Head: Normocephalic.     Right Ear: Tympanic membrane normal.     Left Ear: Tympanic membrane normal.     Nose:      Mouth/Throat:     Lips: Pink.     Mouth: Oral lesions present.     Dentition: Gum lesions present. No gingival swelling.     Palate: Lesions present.     Pharynx: Oropharynx is clear.     Comments: Pt has ulcers under tongue,mouth, inner lower lip Cardiovascular:     Rate and Rhythm: Normal rate and regular rhythm.     Heart sounds: Normal heart sounds.  Pulmonary:     Effort: Pulmonary effort is normal.     Breath sounds: Normal breath sounds and air entry.  Neurological:  General: No focal deficit present.     Mental Status: She is alert and oriented to person, place, and time.     GCS: GCS eye subscore is 4. GCS verbal subscore is 5. GCS motor subscore is 6.     Cranial  Nerves: No cranial nerve deficit.     Sensory: No sensory deficit.  Psychiatric:        Attention and Perception: Attention normal.        Mood and Affect: Mood normal.        Speech: Speech normal.        Behavior: Behavior normal. Behavior is cooperative.      UC Treatments / Results  Labs (all labs ordered are listed, but only abnormal results are displayed) Labs Reviewed - No data to display  EKG   Radiology No results found.  Procedures Procedures (including critical care time)  Medications Ordered in UC Medications - No data to display  Initial Impression / Assessment and Plan / UC Course  I have reviewed the triage vital signs and the nursing notes.  Pertinent labs & imaging results that were available during my care of the patient were reviewed by me and considered in my medical decision making (see chart for details).    Discussed exam findings and plan of care with patient, strict go to ER precautions given.   Patient verbalized understanding to this provider.  Ddx: Aphthous ulcer, nasal sore,pimple,viral illness,allergies Final Clinical Impressions(s) / UC Diagnoses   Final diagnoses:  Aphthous ulcer  Nasal sore     Discharge Instructions      Take meds as directed Avoid spicy,salty,acidic foods Follow up with dentist/PCP Return as needed Go to ER for new or worsening issues or cocnerns     ED Prescriptions     Medication Sig Dispense Auth. Provider   magic mouthwash (nystatin, lidocaine, diphenhydrAMINE, alum & mag hydroxide) suspension Swish and spit 5 mLs 3 (three) times daily as needed for mouth pain. 180 mL Nazaret Chea, NP   mupirocin ointment (BACTROBAN) 2 % Apply 1 Application topically 2 (two) times daily for 14 days. Bilateral nares 28 g Harvy Riera, NP      PDMP not reviewed this encounter.   Clancy Gourd, NP 09/11/23 2053

## 2023-09-11 NOTE — ED Triage Notes (Signed)
 Patient states that she has blisters or sores on her gums and tongue that started 2 days ago.  Patient also reports left sided jaw and dental pain.  Patient also reports a redness and burning sensation inside her nose.  Patient states that she wants to be checked for MRSA.

## 2023-09-11 NOTE — Discharge Instructions (Addendum)
 Take meds as directed Avoid spicy,salty,acidic foods Follow up with dentist/PCP Return as needed Go to ER for new or worsening issues or cocnerns

## 2024-02-09 HISTORY — PX: OTHER SURGICAL HISTORY: SHX169

## 2024-03-23 ENCOUNTER — Other Ambulatory Visit: Payer: Self-pay | Admitting: Obstetrics and Gynecology

## 2024-03-23 DIAGNOSIS — Z3041 Encounter for surveillance of contraceptive pills: Secondary | ICD-10-CM

## 2024-04-07 ENCOUNTER — Ambulatory Visit: Admitting: Obstetrics and Gynecology

## 2024-04-07 ENCOUNTER — Encounter: Payer: Self-pay | Admitting: Obstetrics and Gynecology

## 2024-04-07 VITALS — BP 118/84 | HR 83 | Ht 61.0 in | Wt 113.8 lb

## 2024-04-07 DIAGNOSIS — Z01411 Encounter for gynecological examination (general) (routine) with abnormal findings: Secondary | ICD-10-CM

## 2024-04-07 DIAGNOSIS — B3731 Acute candidiasis of vulva and vagina: Secondary | ICD-10-CM | POA: Diagnosis not present

## 2024-04-07 DIAGNOSIS — N644 Mastodynia: Secondary | ICD-10-CM

## 2024-04-07 DIAGNOSIS — Z01419 Encounter for gynecological examination (general) (routine) without abnormal findings: Secondary | ICD-10-CM

## 2024-04-07 DIAGNOSIS — Z3041 Encounter for surveillance of contraceptive pills: Secondary | ICD-10-CM

## 2024-04-07 DIAGNOSIS — Z1231 Encounter for screening mammogram for malignant neoplasm of breast: Secondary | ICD-10-CM

## 2024-04-07 MED ORDER — NORETHINDRONE 0.35 MG PO TABS: 1.0000 | ORAL_TABLET | Freq: Every day | ORAL | 3 refills | Status: AC

## 2024-04-07 MED ORDER — FLUCONAZOLE 150 MG PO TABS: 150.0000 mg | ORAL_TABLET | Freq: Once | ORAL | 2 refills | Status: AC

## 2024-04-07 NOTE — Patient Instructions (Addendum)
 I value your feedback and you entrusting [place] with your care. If you get a [facility] patient survey, I would appreciate you taking the time to let us know about your experience today. Thank you!  [facility] ([place])--[phone]

## 2024-04-07 NOTE — Progress Notes (Signed)
 PCP: [place] HERO, MD   Chief Complaint  Patient presents with   Gynecologic Exam    No concerns    HPI:      Dominique Valdez is a 48 y.o. [id number] whose LMP was Patient's last menstrual period was 03/21/2024 (approximate)., presents today for her annual examination.  Her menses are absent on POPs; has occas light to mod bleeding for 6 days a couple times a year. No BTB, mild dysmen, improved with NSAIDs. Occas feels hot.   Sex activity: single partner, contraception - oral progesterone-only contraceptive. No pain/bleeding/dryness  Last Pap: 04/02/23 Results were: no abnormalities /neg HPV DNA.   Last mammogram: 05/14/23 Results were: normal--routine follow-up in 12 months There is no FH of breast cancer. There is no FH of ovarian cancer. The patient does occas do self-breast exams. Hx of LT breast cyst in past. Had intermittent LT breast pain UOQ, without discrete mass, last yr with neg mammo and u/s. Pt has less pain this yr but still happens occas. Drinking less caffeine. Concerned about need for dx mammo due to dense breasts.    Colonoscopy: 3/23 with [facility] due to rectal pain/pressure. Diagnosed with internal hemorrhoids and doing banding. Repeat due after 10 yrs. Neg cologuard 9/22 but then started having sx.   Tobacco use: The patient denies current or previous tobacco use. Alcohol use: none No drug use Exercise: occas exercise  She does get adequate calcium and Vitamin D in her diet.  Labs with PCP.   She occas gets yeast vag sx and would like diflucan  Rx. Hx of DM; not controlled last yr but better this yr with med change.   Past Medical History:  Diagnosis Date   Adnexal mass    Diabetes mellitus without complication (HCC)    GERD (gastroesophageal reflux disease)    Hyperlipidemia    Screening for colon cancer 04/2021   NEG Cologuard; repeat after 3 yrs   Urethral diverticulum     Past Surgical History:  Procedure Laterality Date   COLONOSCOPY WITH  PROPOFOL  N/A 10/11/2021   Procedure: COLONOSCOPY WITH PROPOFOL ;  Surgeon: Unk Corinn Skiff, MD;  Location: [facility] ENDOSCOPY;  Service: Gastroenterology;  Laterality: N/A;   COLPOSCOPY  2001   normal [facility]   ESOPHAGOGASTRODUODENOSCOPY  2000   [place]   excision of urethral diverticulum  02/2008   myringtomy     as a child   TYMPANOPLASTY Left 2004   widsom teeth  02/09/2024    Family History  Problem Relation Age of Onset   Coronary artery disease Mother    Diabetes Mother        Type 2   Hypertension Mother    Hypertension Father    Diabetes Brother        Type 2   Hypertension Maternal Grandmother    Renal Disease Maternal Grandmother    Leukemia Paternal Grandmother 74   Hypertension Paternal Grandfather    Alzheimer's disease Paternal Grandfather    Hyperthyroidism Paternal Grandfather    Breast cancer Neg Hx     Social History   Socioeconomic History   Marital status: Married    Spouse name: Not on file   Number of children: 2   Years of education: Not on file   Highest education level: Not on file  Occupational History   Occupation: Patient Care Coordinator  Tobacco Use   Smoking status: Never   Smokeless tobacco: Never  Vaping Use   Vaping status: Never Used  Substance and Sexual Activity   Alcohol use: Yes    Comment: rare   Drug use: No   Sexual activity: Yes    Birth control/protection: Pill    Comment: minipill  Other Topics Concern   Not on file  Social History Narrative   Not on file   Social Drivers of Health   Financial Resource Strain: Not on file  Food Insecurity: Not on file  Transportation Needs: Not on file  Physical Activity: Inactive (01/13/2018)   Exercise Vital Sign    Days of Exercise per Week: 0 days    Minutes of Exercise per Session: 0 min  Stress: No Stress Concern Present (01/13/2018)   [employer] of Occupational Health - Occupational Stress Questionnaire    Feeling of Stress : Not at all  Social Connections: Not  on file  Intimate Partner Violence: Not on file     Current Outpatient Medications:    fluconazole  (DIFLUCAN ) 150 MG tablet, Take 1 tablet (150 mg total) by mouth once for 1 dose. May repeat in 3 days if still having symptoms, Disp: 2 tablet, Rfl: 2   fexofenadine (ALLEGRA) 180 MG tablet, Take 180 mg by mouth daily., Disp: , Rfl:    ibuprofen (ADVIL,MOTRIN) 800 MG tablet, , Disp: , Rfl: 5   lovastatin (MEVACOR) 10 MG tablet, Take 1 tablet by mouth daily., Disp: , Rfl:    magic mouthwash (nystatin , lidocaine, diphenhydrAMINE, alum & mag hydroxide) suspension, Swish and spit 5 mLs 3 (three) times daily as needed for mouth pain., Disp: 180 mL, Rfl: 0   norethindrone  (MICRONOR ) 0.35 MG tablet, Take 1 tablet (0.35 mg total) by mouth daily., Disp: 84 tablet, Rfl: 3   Saxagliptin-Metformin 11-998 MG TB24, Take 1 tablet by mouth 2 (two) times daily., Disp: , Rfl:    triamcinolone cream (KENALOG) 0.1 %, Apply topically., Disp: , Rfl:    VITAMIN D-1000 MAX ST 25 MCG (1000 UT) tablet, Take 1,000 Units by mouth daily., Disp: , Rfl:      ROS:  Review of Systems  Constitutional:  Negative for fatigue, fever and unexpected weight change.  Respiratory:  Negative for cough, shortness of breath and wheezing.   Cardiovascular:  Negative for chest pain, palpitations and leg swelling.  Gastrointestinal:  Negative for blood in stool, constipation, diarrhea, nausea and vomiting.  Endocrine: Negative for cold intolerance, heat intolerance and polyuria.  Genitourinary:  Negative for dyspareunia, dysuria, flank pain, frequency, genital sores, hematuria, menstrual problem, pelvic pain, urgency, vaginal bleeding, vaginal discharge and vaginal pain.  Musculoskeletal:  Negative for back pain, joint swelling and myalgias.  Skin:  Negative for rash.  Neurological:  Negative for dizziness, syncope, light-headedness, numbness and headaches.  Hematological:  Negative for adenopathy.  Psychiatric/Behavioral:  Negative  for agitation, confusion, sleep disturbance and suicidal ideas. The patient is not nervous/anxious.    BREAST: breast tenderness    Objective: BP 118/84 (BP Location: Right Arm, Patient Position: Sitting, Cuff Size: Normal)   Pulse 83   Ht 5' 1 (1.549 m)   Wt 113 lb 12.8 oz (51.6 kg)   LMP 03/21/2024 (Approximate)   BMI 21.50 kg/m    Physical Exam Constitutional:      Appearance: She is well-developed.  Genitourinary:     Vulva normal.     Right Labia: No rash, tenderness or lesions.    Left Labia: No tenderness, lesions or rash.    No vaginal discharge, erythema or tenderness.      Right Adnexa: not tender  and no mass present.    Left Adnexa: not tender and no mass present.    No cervical friability or polyp.     Uterus is not enlarged or tender.  Breasts:    Right: No mass, nipple discharge, skin change or tenderness.     Left: No mass, nipple discharge, skin change or tenderness.  Neck:     Thyroid: No thyromegaly.  Cardiovascular:     Rate and Rhythm: Normal rate and regular rhythm.     Heart sounds: Normal heart sounds. No murmur heard. Pulmonary:     Effort: Pulmonary effort is normal.     Breath sounds: Normal breath sounds.  Abdominal:     Palpations: Abdomen is soft.     Tenderness: There is no abdominal tenderness. There is no guarding or rebound.  Musculoskeletal:        General: Normal range of motion.     Cervical back: Normal range of motion.  Lymphadenopathy:     Cervical: No cervical adenopathy.  Neurological:     General: No focal deficit present.     Mental Status: She is alert and oriented to person, place, and time.     Cranial Nerves: No cranial nerve deficit.  Skin:    General: Skin is warm and dry.  Psychiatric:        Mood and Affect: Mood normal.        Behavior: Behavior normal.        Thought Content: Thought content normal.        Judgment: Judgment normal.  Vitals reviewed.     Assessment/Plan: Encounter for annual routine  gynecological examination  Encounter for surveillance of contraceptive pills - Plan: norethindrone  (MICRONOR ) 0.35 MG tablet; Rx RF eRxd.   Encounter for screening mammogram for malignant neoplasm of breast - Plan: MM 3D SCREENING MAMMOGRAM BILATERAL BREAST; pt to schedule mammo. Discussed no increased screening options currently for dense breasts.   Breast pain, left--improved this yr, can do screening mammo only; neg imaging last yr and sx better. D/c caffeine.   Yeast vaginitis - Plan: fluconazole  (DIFLUCAN ) 150 MG tablet; Rx RF eRxd. Pt's DM better controlled.   Meds ordered this encounter  Medications   norethindrone  (MICRONOR ) 0.35 MG tablet    Sig: Take 1 tablet (0.35 mg total) by mouth daily.    Dispense:  84 tablet    Refill:  3    Supervising Provider:   ROBY, MICIA [[id number]]   fluconazole  (DIFLUCAN ) 150 MG tablet    Sig: Take 1 tablet (150 mg total) by mouth once for 1 dose. May repeat in 3 days if still having symptoms    Dispense:  2 tablet    Refill:  2    Supervising Provider:   ROBY, MICIA [[id number]]           GYN counsel breast self exam, mammography screening, menopause, adequate intake of calcium and vitamin D, diet and exercise    F/U  Return in about 1 year (around 04/07/2025).  Dyanara Cozza B. Libbi Towner, PA-C 04/07/2024 3:55 PM
